# Patient Record
Sex: Male | Born: 1945
Health system: Southern US, Community
[De-identification: ages and names within clinical notes are randomized; demographics above are authoritative.]

## PROBLEM LIST (undated history)

## (undated) DIAGNOSIS — I2510 Atherosclerotic heart disease of native coronary artery without angina pectoris: Secondary | ICD-10-CM

## (undated) DIAGNOSIS — I509 Heart failure, unspecified: Secondary | ICD-10-CM

## (undated) DIAGNOSIS — M1990 Unspecified osteoarthritis, unspecified site: Secondary | ICD-10-CM

## (undated) DIAGNOSIS — E119 Type 2 diabetes mellitus without complications: Secondary | ICD-10-CM

## (undated) DIAGNOSIS — I10 Essential (primary) hypertension: Secondary | ICD-10-CM

## (undated) DIAGNOSIS — J449 Chronic obstructive pulmonary disease, unspecified: Secondary | ICD-10-CM

## (undated) DIAGNOSIS — E079 Disorder of thyroid, unspecified: Secondary | ICD-10-CM

---

## 2020-10-02 ENCOUNTER — Other Ambulatory Visit (HOSPITAL_COMMUNITY): Payer: Self-pay | Admitting: Neurosurgery

## 2020-10-02 DIAGNOSIS — G2111 Neuroleptic induced parkinsonism: Secondary | ICD-10-CM

## 2020-11-02 ENCOUNTER — Other Ambulatory Visit (HOSPITAL_COMMUNITY): Payer: Self-pay

## 2020-11-02 ENCOUNTER — Inpatient Hospital Stay (HOSPITAL_COMMUNITY): Admission: RE | Admit: 2020-11-02 | Payer: Self-pay | Source: Ambulatory Visit

## 2020-11-03 ENCOUNTER — Encounter (HOSPITAL_COMMUNITY)
Admission: RE | Admit: 2020-11-03 | Discharge: 2020-11-03 | Disposition: A | Discharge status: Home / Self Care | Payer: No Typology Code available for payment source | Source: Ambulatory Visit | Attending: Neurosurgery | Admitting: Neurosurgery

## 2020-11-03 ENCOUNTER — Other Ambulatory Visit: Payer: Self-pay

## 2020-11-03 DIAGNOSIS — G2111 Neuroleptic induced parkinsonism: Secondary | ICD-10-CM | POA: Diagnosis not present

## 2020-11-03 MED ORDER — IOFLUPANE I 123 185 MBQ/2.5ML IV SOLN
4.2000 | Freq: Once | INTRAVENOUS | Status: AC | PRN
  Administered 2020-11-03: 4.2 via INTRAVENOUS
  Filled 2020-11-03: qty 5

## 2020-11-03 MED ORDER — POTASSIUM IODIDE (ANTIDOTE) 130 MG PO TABS: 130.0000 mg | ORAL_TABLET | Freq: Once | ORAL | Status: DC

## 2020-11-03 MED ORDER — POTASSIUM IODIDE (ANTIDOTE) 130 MG PO TABS
ORAL_TABLET | ORAL | Status: AC
  Filled 2020-11-03: qty 1

## 2021-01-11 ENCOUNTER — Other Ambulatory Visit: Payer: Self-pay

## 2021-01-11 ENCOUNTER — Emergency Department (HOSPITAL_COMMUNITY): Payer: No Typology Code available for payment source

## 2021-01-11 ENCOUNTER — Encounter (HOSPITAL_COMMUNITY): Payer: Self-pay | Admitting: Emergency Medicine

## 2021-01-11 ENCOUNTER — Inpatient Hospital Stay (HOSPITAL_COMMUNITY)
Admission: EM | Admit: 2021-01-11 | Discharge: 2021-01-14 | DRG: 312 | Disposition: A | Discharge status: Home / Self Care | Payer: No Typology Code available for payment source | Attending: Internal Medicine | Admitting: Internal Medicine

## 2021-01-11 DIAGNOSIS — K219 Gastro-esophageal reflux disease without esophagitis: Secondary | ICD-10-CM | POA: Diagnosis present

## 2021-01-11 DIAGNOSIS — I952 Hypotension due to drugs: Principal | ICD-10-CM | POA: Diagnosis present

## 2021-01-11 DIAGNOSIS — I110 Hypertensive heart disease with heart failure: Secondary | ICD-10-CM | POA: Diagnosis present

## 2021-01-11 DIAGNOSIS — T463X5A Adverse effect of coronary vasodilators, initial encounter: Secondary | ICD-10-CM | POA: Diagnosis present

## 2021-01-11 DIAGNOSIS — G309 Alzheimer's disease, unspecified: Secondary | ICD-10-CM | POA: Diagnosis present

## 2021-01-11 DIAGNOSIS — Z87892 Personal history of anaphylaxis: Secondary | ICD-10-CM

## 2021-01-11 DIAGNOSIS — T438X5A Adverse effect of other psychotropic drugs, initial encounter: Secondary | ICD-10-CM | POA: Diagnosis present

## 2021-01-11 DIAGNOSIS — T447X5A Adverse effect of beta-adrenoreceptor antagonists, initial encounter: Secondary | ICD-10-CM | POA: Diagnosis present

## 2021-01-11 DIAGNOSIS — Z66 Do not resuscitate: Secondary | ICD-10-CM | POA: Diagnosis present

## 2021-01-11 DIAGNOSIS — Z87891 Personal history of nicotine dependence: Secondary | ICD-10-CM

## 2021-01-11 DIAGNOSIS — Z79899 Other long term (current) drug therapy: Secondary | ICD-10-CM

## 2021-01-11 DIAGNOSIS — J449 Chronic obstructive pulmonary disease, unspecified: Secondary | ICD-10-CM | POA: Diagnosis present

## 2021-01-11 DIAGNOSIS — I10 Essential (primary) hypertension: Secondary | ICD-10-CM | POA: Diagnosis not present

## 2021-01-11 DIAGNOSIS — Z888 Allergy status to other drugs, medicaments and biological substances status: Secondary | ICD-10-CM

## 2021-01-11 DIAGNOSIS — E039 Hypothyroidism, unspecified: Secondary | ICD-10-CM | POA: Diagnosis present

## 2021-01-11 DIAGNOSIS — F32A Depression, unspecified: Secondary | ICD-10-CM | POA: Diagnosis present

## 2021-01-11 DIAGNOSIS — R001 Bradycardia, unspecified: Secondary | ICD-10-CM | POA: Diagnosis not present

## 2021-01-11 DIAGNOSIS — E119 Type 2 diabetes mellitus without complications: Secondary | ICD-10-CM | POA: Diagnosis present

## 2021-01-11 DIAGNOSIS — Z20822 Contact with and (suspected) exposure to covid-19: Secondary | ICD-10-CM | POA: Diagnosis present

## 2021-01-11 DIAGNOSIS — I5032 Chronic diastolic (congestive) heart failure: Secondary | ICD-10-CM | POA: Diagnosis present

## 2021-01-11 DIAGNOSIS — I509 Heart failure, unspecified: Secondary | ICD-10-CM

## 2021-01-11 DIAGNOSIS — E669 Obesity, unspecified: Secondary | ICD-10-CM | POA: Diagnosis present

## 2021-01-11 DIAGNOSIS — F0280 Dementia in other diseases classified elsewhere without behavioral disturbance: Secondary | ICD-10-CM | POA: Diagnosis present

## 2021-01-11 DIAGNOSIS — I2510 Atherosclerotic heart disease of native coronary artery without angina pectoris: Secondary | ICD-10-CM | POA: Diagnosis present

## 2021-01-11 DIAGNOSIS — T461X5A Adverse effect of calcium-channel blockers, initial encounter: Secondary | ICD-10-CM | POA: Diagnosis present

## 2021-01-11 DIAGNOSIS — Z91041 Radiographic dye allergy status: Secondary | ICD-10-CM

## 2021-01-11 DIAGNOSIS — F0390 Unspecified dementia without behavioral disturbance: Secondary | ICD-10-CM

## 2021-01-11 DIAGNOSIS — G4733 Obstructive sleep apnea (adult) (pediatric): Secondary | ICD-10-CM | POA: Diagnosis present

## 2021-01-11 DIAGNOSIS — T441X5A Adverse effect of other parasympathomimetics [cholinergics], initial encounter: Secondary | ICD-10-CM | POA: Diagnosis present

## 2021-01-11 DIAGNOSIS — M1990 Unspecified osteoarthritis, unspecified site: Secondary | ICD-10-CM | POA: Diagnosis present

## 2021-01-11 HISTORY — DX: Type 2 diabetes mellitus without complications: E11.9

## 2021-01-11 HISTORY — DX: Atherosclerotic heart disease of native coronary artery without angina pectoris: I25.10

## 2021-01-11 HISTORY — DX: Disorder of thyroid, unspecified: E07.9

## 2021-01-11 HISTORY — DX: Chronic obstructive pulmonary disease, unspecified: J44.9

## 2021-01-11 HISTORY — DX: Unspecified osteoarthritis, unspecified site: M19.90

## 2021-01-11 HISTORY — DX: Essential (primary) hypertension: I10

## 2021-01-11 HISTORY — DX: Heart failure, unspecified: I50.9

## 2021-01-11 LAB — COMPREHENSIVE METABOLIC PANEL
ALT: 35 U/L (ref 0–44)
AST: 28 U/L (ref 15–41)
Albumin: 4 g/dL (ref 3.5–5.0)
Alkaline Phosphatase: 85 U/L (ref 38–126)
Anion gap: 6 (ref 5–15)
BUN: 17 mg/dL (ref 8–23)
CO2: 30 mmol/L (ref 22–32)
Calcium: 9.2 mg/dL (ref 8.9–10.3)
Chloride: 103 mmol/L (ref 98–111)
Creatinine, Ser: 1.02 mg/dL (ref 0.61–1.24)
GFR, Estimated: >60 mL/min (ref >60)
Glucose, Bld: 104 mg/dL — ABNORMAL HIGH (ref 70–99)
Potassium: 4.3 mmol/L (ref 3.5–5.1)
Sodium: 139 mmol/L (ref 135–145)
Total Bilirubin: 0.7 mg/dL (ref 0.3–1.2)
Total Protein: 7 g/dL (ref 6.5–8.1)

## 2021-01-11 LAB — CBC
HCT: 40.6 % (ref 39.0–52.0)
Hemoglobin: 13.5 g/dL (ref 13.0–17.0)
MCH: 32.8 pg (ref 26.0–34.0)
MCHC: 33.3 g/dL (ref 30.0–36.0)
MCV: 98.5 fL (ref 80.0–100.0)
Platelets: 267 10*3/uL (ref 150–400)
RBC: 4.12 MIL/uL — ABNORMAL LOW (ref 4.22–5.81)
RDW: 12.4 % (ref 11.5–15.5)
WBC: 10.9 10*3/uL — ABNORMAL HIGH (ref 4.0–10.5)
nRBC: 0 % (ref 0.0–0.2)

## 2021-01-11 LAB — URINALYSIS, ROUTINE W REFLEX MICROSCOPIC
Bilirubin Urine: NEGATIVE
Glucose, UA: NEGATIVE mg/dL
Hgb urine dipstick: NEGATIVE
Ketones, ur: NEGATIVE mg/dL
Leukocytes,Ua: NEGATIVE
Nitrite: NEGATIVE
Protein, ur: NEGATIVE mg/dL
Specific Gravity, Urine: 1.016 (ref 1.005–1.030)
pH: 6 (ref 5.0–8.0)

## 2021-01-11 LAB — CBG MONITORING, ED: Glucose-Capillary: 123 mg/dL — ABNORMAL HIGH (ref 70–99)

## 2021-01-11 LAB — RESP PANEL BY RT-PCR (FLU A&B, COVID) ARPGX2
Influenza A by PCR: NEGATIVE
Influenza B by PCR: NEGATIVE
SARS Coronavirus 2 by RT PCR: NEGATIVE

## 2021-01-11 LAB — TROPONIN I (HIGH SENSITIVITY): Troponin I (High Sensitivity): 11 ng/L (ref <18)

## 2021-01-11 LAB — LACTIC ACID, PLASMA: Lactic Acid, Venous: 1.3 mmol/L (ref 0.5–1.9)

## 2021-01-11 MED ORDER — KETOROLAC TROMETHAMINE 0.5 % OP SOLN
1.0000 [drp] | Freq: Four times a day (QID) | OPHTHALMIC | Status: DC | PRN
  Administered 2021-01-12 – 2021-01-13 (×2): 1 [drp] via OPHTHALMIC
  Filled 2021-01-11: qty 5

## 2021-01-11 MED ORDER — AMMONIUM LACTATE 12 % EX LOTN
1.0000 "application " | TOPICAL_LOTION | Freq: Every day | CUTANEOUS | Status: DC
  Administered 2021-01-12: 1 via TOPICAL
  Filled 2021-01-11: qty 225

## 2021-01-11 MED ORDER — MULTIVITAMIN PO TABS: 1.0000 | ORAL_TABLET | Freq: Every day | ORAL | Status: DC

## 2021-01-11 MED ORDER — ACETAMINOPHEN 650 MG RE SUPP: 650.0000 mg | Freq: Four times a day (QID) | RECTAL | Status: DC | PRN

## 2021-01-11 MED ORDER — LOSARTAN POTASSIUM 50 MG PO TABS
100.0000 mg | ORAL_TABLET | Freq: Every day | ORAL | Status: DC
  Administered 2021-01-12 – 2021-01-14 (×3): 100 mg via ORAL
  Filled 2021-01-11 (×4): qty 2

## 2021-01-11 MED ORDER — MUPIROCIN 2 % EX OINT
1.0000 "application " | TOPICAL_OINTMENT | Freq: Two times a day (BID) | CUTANEOUS | Status: DC | PRN
  Filled 2021-01-11: qty 22

## 2021-01-11 MED ORDER — MEMANTINE HCL 10 MG PO TABS
20.0000 mg | ORAL_TABLET | Freq: Every day | ORAL | Status: DC
  Administered 2021-01-11 – 2021-01-13 (×3): 20 mg via ORAL
  Filled 2021-01-11 (×4): qty 2

## 2021-01-11 MED ORDER — SENNA-DOCUSATE SODIUM 8.6-50 MG PO TABS: 2.0000 | ORAL_TABLET | Freq: Every day | ORAL | Status: DC | PRN

## 2021-01-11 MED ORDER — ROSUVASTATIN CALCIUM 5 MG PO TABS
10.0000 mg | ORAL_TABLET | Freq: Every day | ORAL | Status: DC
  Administered 2021-01-12 – 2021-01-13 (×3): 10 mg via ORAL
  Filled 2021-01-11 (×3): qty 2

## 2021-01-11 MED ORDER — IPRATROPIUM BROMIDE 0.06 % NA SOLN
2.0000 | Freq: Four times a day (QID) | NASAL | Status: DC
  Administered 2021-01-12 – 2021-01-13 (×4): 2 via NASAL
  Filled 2021-01-11 (×2): qty 15

## 2021-01-11 MED ORDER — PANTOPRAZOLE SODIUM 40 MG PO TBEC
40.0000 mg | DELAYED_RELEASE_TABLET | Freq: Every day | ORAL | Status: DC
  Administered 2021-01-12 – 2021-01-14 (×3): 40 mg via ORAL
  Filled 2021-01-11 (×3): qty 1

## 2021-01-11 MED ORDER — SENNOSIDES-DOCUSATE SODIUM 8.6-50 MG PO TABS: 2.0000 | ORAL_TABLET | Freq: Every day | ORAL | Status: DC | PRN

## 2021-01-11 MED ORDER — DONEPEZIL HCL 10 MG PO TABS
10.0000 mg | ORAL_TABLET | Freq: Every day | ORAL | Status: DC
  Administered 2021-01-11 – 2021-01-13 (×3): 10 mg via ORAL
  Filled 2021-01-11 (×3): qty 1

## 2021-01-11 MED ORDER — ENOXAPARIN SODIUM 40 MG/0.4ML ~~LOC~~ SOLN
40.0000 mg | SUBCUTANEOUS | Status: DC
  Administered 2021-01-12: 40 mg via SUBCUTANEOUS
  Filled 2021-01-11: qty 0.4

## 2021-01-11 MED ORDER — AQUAPHILIC/CARBAMIDE 10 % EX OINT: 1.0000 "application " | TOPICAL_OINTMENT | Freq: Every day | CUTANEOUS | Status: DC

## 2021-01-11 MED ORDER — ASPIRIN EC 81 MG PO TBEC
81.0000 mg | DELAYED_RELEASE_TABLET | Freq: Every day | ORAL | Status: DC
  Administered 2021-01-12 – 2021-01-14 (×3): 81 mg via ORAL
  Filled 2021-01-11 (×3): qty 1

## 2021-01-11 MED ORDER — MIRTAZAPINE 15 MG PO TABS
7.5000 mg | ORAL_TABLET | Freq: Every day | ORAL | Status: DC
  Administered 2021-01-11 – 2021-01-13 (×3): 7.5 mg via ORAL
  Filled 2021-01-11 (×3): qty 1

## 2021-01-11 MED ORDER — ADULT MULTIVITAMIN W/MINERALS CH
1.0000 | ORAL_TABLET | Freq: Every day | ORAL | Status: DC
  Administered 2021-01-12 – 2021-01-14 (×3): 1 via ORAL
  Filled 2021-01-11 (×3): qty 1

## 2021-01-11 MED ORDER — ACETAMINOPHEN 325 MG PO TABS: 650.0000 mg | ORAL_TABLET | Freq: Four times a day (QID) | ORAL | Status: DC | PRN

## 2021-01-11 MED ORDER — POTASSIUM CHLORIDE CRYS ER 20 MEQ PO TBCR
10.0000 meq | EXTENDED_RELEASE_TABLET | Freq: Two times a day (BID) | ORAL | Status: DC
  Administered 2021-01-12 (×2): 20 meq via ORAL
  Filled 2021-01-11 (×2): qty 1

## 2021-01-11 MED ORDER — QUETIAPINE FUMARATE 25 MG PO TABS
25.0000 mg | ORAL_TABLET | Freq: Every day | ORAL | Status: DC
  Administered 2021-01-12 – 2021-01-13 (×3): 25 mg via ORAL
  Filled 2021-01-11 (×4): qty 1

## 2021-01-11 MED ORDER — TRIAMCINOLONE ACETONIDE 0.1 % EX CREA
1.0000 "application " | TOPICAL_CREAM | Freq: Every day | CUTANEOUS | Status: DC
  Administered 2021-01-12 – 2021-01-13 (×2): 1 via TOPICAL
  Filled 2021-01-11: qty 15

## 2021-01-11 MED ORDER — LORATADINE 10 MG PO TABS
10.0000 mg | ORAL_TABLET | Freq: Every day | ORAL | Status: DC
  Administered 2021-01-12 – 2021-01-14 (×3): 10 mg via ORAL
  Filled 2021-01-11 (×3): qty 1

## 2021-01-11 MED ORDER — CITALOPRAM HYDROBROMIDE 40 MG PO TABS
40.0000 mg | ORAL_TABLET | Freq: Every day | ORAL | Status: DC
  Administered 2021-01-12 – 2021-01-13 (×2): 40 mg via ORAL
  Filled 2021-01-11 (×3): qty 1

## 2021-01-11 MED ORDER — FUROSEMIDE 40 MG PO TABS
40.0000 mg | ORAL_TABLET | Freq: Two times a day (BID) | ORAL | Status: DC
  Administered 2021-01-12 – 2021-01-13 (×4): 40 mg via ORAL
  Filled 2021-01-11 (×4): qty 1

## 2021-01-11 MED ORDER — SULFASALAZINE 500 MG PO TABS
500.0000 mg | ORAL_TABLET | Freq: Two times a day (BID) | ORAL | Status: DC
  Administered 2021-01-12 – 2021-01-14 (×6): 500 mg via ORAL
  Filled 2021-01-11 (×8): qty 1

## 2021-01-11 MED ORDER — FERROUS SULFATE 325 (65 FE) MG PO TABS
325.0000 mg | ORAL_TABLET | Freq: Two times a day (BID) | ORAL | Status: DC
  Administered 2021-01-12 – 2021-01-14 (×5): 325 mg via ORAL
  Filled 2021-01-11 (×5): qty 1

## 2021-01-11 NOTE — H&P (Addendum)
Date: 01/11/2021               Patient Name:  Stephen Roberson MRN: [id number]  DOB: 03-31-1946 Age / Sex: 75 y.o., male   PCP: [facility]         Medical Service: Internal Medicine Teaching Service         Attending Physician: Dr. Oswaldo Done, Marquita Palms, *    First Contact: Dr. Laural Benes Pager: [phone]  Second Contact: Dr. Barbaraann Faster Pager: [phone]       After Hours (After 5p/  First Contact Pager: [phone]  weekends / holidays): Second Contact Pager: [phone]   Chief Complaint: Bradycardia  History of Present Illness: Mr. Stephen Roberson is a retired veteran 75 year old male with a medical history significant of hypertension, hyperlipidemia, coronary artery disease, COPD, dementia and congestive heart failure presenting with bradycardia and low blood pressures.  History was mostly obtained from the wife at the bedside.  He was in his usual state of health up until this morning.  His wife checked his vitals and found his heart rate to be 38 and blood pressure to be around 70/50.  She reports that he looks a little bit pale and reported chest pain and right-sided neck pain, both of which are intermittent and chronic in nature.  She denies any changes in his mentation at this time.  He did feel dizzy upon standing up. She called EMS who gave the patient 325 mg aspirin.  His symptoms resolved after about an hour and a half.  He took all of his medications this morning.  Wife denies any recent changes to his medications or diet.  He has been eating and drinking well.  He has been on most all of his medications for many years.  Only changes to medication recently was stopping his Plavix 75 mg couple years ago.  Of note, she did mention his pulse was 47 yesterday.  She states his heart rate typically resides in the 50s and systolic blood pressures are normally 110s.  At baseline, the patient is generally very tired and often stares off into space however he is able to communicate with his family  and feed himself.  He does get confused at times. He requires a lot of assistance with his ADLs such as maneuvering around his house and putting on his shoes.  He is able to ambulate on his own however typically needs guidance and at times use of a walker.  Patient reports his right-sided neck pain has been going on for a couple months.  Pain is intermittent and worse with movement.  He also endorsed intermittent nonradiating left-sided chest pain for the past few months.  Currently his symptom-free.  Denies any diaphoresis, syncope, shortness of breath, abdominal pain, nausea or vomiting, constipation or diarrhea, and orthopnea.  He does state he feels more full than usual.  Endorses intermittent bilateral lower extremity swelling when he does not elevate his legs.   Meds:  Current Meds  Medication Sig  . acetaminophen (TYLENOL) 500 MG tablet Take 1,000 mg by mouth in the morning and at bedtime.  Marland Kitchen ammonium lactate (LAC-HYDRIN) 12 % lotion Apply 1 application topically daily.  Marland Kitchen aspirin 81 MG EC tablet Take 81 mg by mouth daily. Swallow whole.  . citalopram (CELEXA) 40 MG tablet Take 40 mg by mouth daily.  Marland Kitchen diltiazem (TIAZAC) 240 MG 24 hr capsule Take 240 mg by mouth in the morning and at bedtime.  . docusate sodium (  COLACE) 250 MG capsule Take 250 mg by mouth 2 (two) times daily.  Marland Kitchen donepezil (ARICEPT) 5 MG tablet Take 10 mg by mouth at bedtime.  . ferrous sulfate 325 (65 FE) MG EC tablet Take 325 mg by mouth in the morning and at bedtime.  . furosemide (LASIX) 40 MG tablet Take 40 mg by mouth 2 (two) times daily.  Marland Kitchen ibuprofen (ADVIL) 800 MG tablet Take 800 mg by mouth every 8 (eight) hours as needed for mild pain.  Marland Kitchen ipratropium (ATROVENT) 0.06 % nasal spray Place 2 sprays into both nostrils 4 (four) times daily.  . isosorbide mononitrate (IMDUR) 60 MG 24 hr tablet Take 60 mg by mouth in the morning and at bedtime.  Marland Kitchen ketorolac (ACULAR) 0.5 % ophthalmic solution Place 1 drop into both eyes  4 (four) times daily as needed (itching eyes).  Marland Kitchen loratadine (CLARITIN) 10 MG tablet Take 10 mg by mouth daily.  Marland Kitchen losartan (COZAAR) 100 MG tablet Take 100 mg by mouth daily.  . memantine (NAMENDA) 10 MG tablet Take 20 mg by mouth at bedtime.  . metoprolol tartrate (LOPRESSOR) 50 MG tablet Take 75 mg by mouth 2 (two) times daily.  . mirtazapine (REMERON) 7.5 MG tablet Take 7.5 mg by mouth at bedtime.  . Multiple Vitamin (MULTIVITAMIN PO) Take 1 tablet by mouth in the morning and at bedtime. PropaxGold with NTFactor Plus Reveratrol & Co Q10 Vitamins: (Multi-vitamin, Multi-Mineral, Antioxidant & Pre/Probiotic Complexes Plus NTFactor  . mupirocin ointment (BACTROBAN) 2 % Apply 1 application topically 2 (two) times daily as needed (rash).  . nitroGLYCERIN (NITROSTAT) 0.4 MG SL tablet Place 0.4 mg under the tongue every 5 (five) minutes as needed for chest pain.  Marland Kitchen omeprazole (PRILOSEC) 20 MG capsule Take 20 mg by mouth 2 (two) times daily before a meal.  . potassium chloride (KLOR-CON) 10 MEQ tablet Take 10-20 mEq by mouth 2 (two) times daily. Take 2 tablets (20 meq) in the morning and Take 1 tablet (10 meq) at bedtime  . QUEtiapine (SEROQUEL) 25 MG tablet Take 25 mg by mouth at bedtime.  . rizatriptan (MAXALT) 10 MG tablet Take 10 mg by mouth every 2 (two) hours as needed for migraine. May repeat in 2 hours if needed  . rosuvastatin (CRESTOR) 10 MG tablet Take 10 mg by mouth at bedtime.  . sennosides-docusate sodium (SENOKOT-S) 8.6-50 MG tablet Take 2 tablets by mouth daily as needed for constipation.  . sulfaSALAzine (AZULFIDINE) 500 MG tablet Take 500 mg by mouth 2 (two) times daily.  Marland Kitchen triamcinolone (KENALOG) 0.1 % Apply 1 application topically daily.  Marland Kitchen triamcinolone 0.1%-silver sulfadiazine 1:1 cream mixture Apply 1 application topically daily.  . Urea (AQUAPHILIC/CARBAMIDE) 10 % OINT Apply 1 application topically in the morning and at bedtime.     Allergies: Allergies as of 01/11/2021 -  Review Complete 01/11/2021  Allergen Reaction Noted  . Clonidine derivatives Anaphylaxis 01/11/2021  . Iodinated diagnostic agents Anaphylaxis 01/11/2021  . Lipitor [atorvastatin] Anaphylaxis 01/11/2021  . Proventil [albuterol] Anaphylaxis 01/11/2021  . Valium [diazepam] Anaphylaxis 01/11/2021   Past Medical History:  Diagnosis Date  . Arthritis   . CHF (congestive heart failure) (HCC)   . COPD (chronic obstructive pulmonary disease) (HCC)   . Coronary artery disease   . Diabetes mellitus without complication (HCC)   . Hypertension   . Thyroid disease    hypothyroidism    Family History:   Family History  Problem Relation Age of Onset  . Cancer Mother   .  Cancer Father   . 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 Lives at home with his wife.  Able to feed himself however requires assistance with most other ADLs.  Has dementia, at baseline able to communicate, at times stares off into space.  Review of Systems: A complete ROS was negative except as per HPI.   Physical Exam: Blood pressure (!) 191/93, pulse (!) 54, temperature 97.9 F (36.6 C), temperature source Oral, resp. rate 16, height 5\' 4"  (1.626 m), weight 107.5 kg, SpO2 96 %.  Physical Exam Vitals reviewed.  Constitutional:      General: He is not in acute distress.    Appearance: Normal appearance. He is not ill-appearing or diaphoretic.  HENT:     Head: Normocephalic and atraumatic.  Eyes:     Extraocular Movements: Extraocular movements intact.     Conjunctiva/sclera: Conjunctivae normal.  Cardiovascular:     Rate and Rhythm: Regular rhythm. Bradycardia present.     Pulses: Normal pulses.     Heart sounds: Normal heart sounds. No murmur heard. No friction rub. No gallop.   Pulmonary:     Effort: No respiratory distress.     Breath sounds: Normal breath sounds. No wheezing or rales.  Chest:     Chest wall: No tenderness.  Abdominal:     General: Bowel  sounds are normal. There is distension.     Palpations: Abdomen is soft.     Tenderness: There is no abdominal tenderness. There is no guarding.  Musculoskeletal:        General: No swelling or tenderness.     Cervical back: Neck supple. No rigidity or tenderness.     Right lower leg: No edema.     Left lower leg: No edema.  Skin:    General: Skin is warm and dry.  Neurological:     Mental Status: He is alert. Mental status is at baseline.  Psychiatric:        Mood and Affect: Mood normal.        Behavior: Behavior normal.        Thought Content: Thought content normal.        Judgment: Judgment normal.     EKG: personally reviewed my interpretation is sinus bradycardia  CXR: personally reviewed my interpretation is cardiomegaly and mild interstitial prominence  Assessment & Plan by Problem: Active Problems:   Bradycardia   Hypertension  Mr. Nilson Tabora is a retired veteran 75 year old male with a medical history significant of hypertension, hyperlipidemia, coronary artery disease, COPD, dementia and congestive heart failure presenting with bradycardia and low blood pressures.  Bradycardia Patient reportedly had a heart rate of 38 at home this morning; heart rate typically resides in the 50s.  Blood pressures were also reported to be 70/50.  At this time the family reported he was pale, dizzy upon standing, and endorsed right-sided neck and left-sided chest pain which is intermittent and chronic in nature.  EMS was subsequently called and EKG showed sinus bradycardia.  Symptoms resolved after about an hour and a half.  Patient is on diltiazem 240 mg twice daily, metoprolol 75 mg twice daily, losartan 100 mg daily and Imdur 60 mg daily.  No recent changes to these medications.  Bradycardia could be in the setting of high doses of metoprolol and diltiazem versus carotid sinus massage.  Patient has had right-sided neck pain for the past few months, question whether he may be indirectly  inducing  a carotid sinus massage when applying pressure to his neck pain. -Cardiac monitoring -If symptomatic may need to consider atropine, chronotropic support or transcutaneous pacing -Hold diltiazem and metoprolol -Follow-up echocardiogram  Hypertension Home medications include diltiazem 240 mg twice daily, metoprolol 75 mg twice daily, furosemide 40 mg twice daily, losartan 100 mg daily and Imdur 60 mg daily.  Patient was hypotensive upon EMS arrival however now with a systolic 183/88.  Blood pressure goal less than 220 systolic.  Will continue to monitor. - Holding diltiazem, metoprolol and Imdur in the setting of bradycardia - Resume losartan 100 mg daily  CHF History of heart failure.   - Continue furosemide 40 mg twice daily - Follow-up echocardiogram  Dementia At baseline, he is able to communicate however needs assistance with most of his ADLs. -Continue home medications donepezil 10 mg, memantine 20 mg, Seroquel 25  Depression -Continue citalopram 40 mg daily  GERD Home medication Meprazole 20 mg twice daily.  Continue Protonix 40 mg daily  OSA - CPAP nightly   Diet: HH VTE ppx: Lovenox Code status: DNR  Dispo: Admit patient to Observation with expected length of stay less than 2 midnights.  SignedJaci Standard: Jhovany Weidinger N, DO 01/11/2021, 9:27 PM  Pager: [phone] After 5pm on weekdays and 1pm on weekends: On Call pager: [phone]

## 2021-01-11 NOTE — ED Provider Notes (Signed)
Patient is a 75 year old male with a history of dementia who is here because his home health nurse found his heart rate was slow and his blood pressure was low.  Patient is in no distress.  Heart 40s, however his blood pressures been appropriate since he has been here. Patient currently on beta blocker and calcium channel blocker. EKG sinus bradycardia. Patient is pending the rest of his labs and work-up.  Physical Exam  BP (!) 183/87 (BP Location: Right Arm)   Pulse 63   Temp 97.7 F (36.5 C) (Oral)   Resp 19   Ht 5\' 4"  (1.626 m)   Wt 107.5 kg   SpO2 96%   BMI 40.68 kg/m   Physical Exam Vitals and nursing note reviewed.  Constitutional:      General: He is not in acute distress.    Appearance: He is well-developed.  HENT:     Head: Normocephalic and atraumatic.     Right Ear: External ear normal.     Left Ear: External ear normal.  Eyes:     Conjunctiva/sclera: Conjunctivae normal.  Cardiovascular:     Rate and Rhythm: Normal rate and regular rhythm.  Pulmonary:     Effort: Pulmonary effort is normal.     Breath sounds: Normal breath sounds.  Abdominal:     Palpations: Abdomen is soft.     Tenderness: There is no abdominal tenderness.  Musculoskeletal:     Cervical back: Neck supple.  Skin:    General: Skin is warm and dry.  Neurological:     Mental Status: He is alert.     ED Course/Procedures   Clinical Course as of 01/12/21 0017  Mon Jan 11, 2021  5331 75 year old male with dementia here after family found his heart rate and blood pressure to be low.  Patient does not appear in any distress.  Bradycardia in the 40s with good blood pressure.  Getting labs EKG chest x-ray.  Likely can be discharged if no acute findings [MB]  1502 Care signed out pending further work up and monitoring.  [LM]  1520 DM CHF HTN on beta blocker, [facility] pt Sh[facility] -> vitals after  Hypotensive and bradycardic (70s systolic) EMS 84 systolic, 30-40s, mental status baseline  Cardiac w/u [JK]     Clinical Course User Index [JK] Kugler, 1503, MD [LM] [place], PA-C [MB] Jeannie Fend, MD    Procedures  MDM   HDS here. HR 39-41, but blood pressure appropriate since arrival.   Patient complaining of chest pain, EKG and troponin ordered. CXR without PTX but with interstitial prominence.  Pending UA, lactic, troponin, COVID.    Normal lactic acid. WBC 10.9.  No evidence of anemia.  No evidence for urinary tract infection.  Troponin 11.  No significant abnormality on CMP.  Given age, comorbidity, medication use, and HR while in ED, feel that patient will benefit from admission for beta-blocker wash out. Will admit to medicine for further evaluation and workup of presenting symptoms.    Kugler, Terrilee Files, MD 01/12/21 01/14/21    Lorenda Cahill, MD 01/13/21 [phone]

## 2021-01-11 NOTE — ED Provider Notes (Signed)
[facility] EMERGENCY DEPARTMENT Provider Note   CSN: [id number] Arrival date & time: 01/11/21  1346     History Chief Complaint  Patient presents with  . Bradycardia    38-40 bpm    Stephen Roberson is a 75 y.o. male.  75 year old male brought in by EMS from home for low BP and low heart rate. History provided by EMS, awaiting arrival of family member. Per EMS, patient found to have BP in the 80s, HR in the 30s. Patient's wife arrives and states patient has advanced dementia, home health nurse gave patient his shower this morning and afterwards checked his vitals and found his blood pressure to be below 70, confirmed on manual and EMS was called.  Also found to have heart rate in the 30s.  Patient's wife has a notebook at bedside with daily recording of vital signs, blood pressure is normally around 110 systolic with heart rates typically in the 40s to 50s.  Patient is on a beta-blocker, medications are administered to me by family.  Patient has been rubbing his neck recently and today around his chest, unsure if these are areas that are hurting him as he is not a good historian.  Family reports normal p.o. intake, normal activity, not recently ill and not prone to urinary tract infection.        Past Medical History:  Diagnosis Date  . Arthritis   . CHF (congestive heart failure) (HCC)   . COPD (chronic obstructive pulmonary disease) (HCC)   . Coronary artery disease   . Diabetes mellitus without complication (HCC)   . Hypertension   . Thyroid disease    hypothyroidism    There are no problems to display for this patient.   History reviewed. No pertinent surgical history.     Family History  Problem Relation Age of Onset  . Cancer Mother   . Cancer Father   . Cancer Brother     Social History   Tobacco Use  . Smoking status: Former Games developer  . Smokeless tobacco: Never Used  . Tobacco comment: quit 20 years ago  Substance Use Topics  . Alcohol  use: Never  . Drug use: Never    Home Medications Prior to Admission medications   Not on File    Allergies    Patient has no allergy information on record.  Review of Systems   Review of Systems  Unable to perform ROS: Dementia    Physical Exam Updated Vital Signs BP (!) 126/56   Pulse (!) 39   Temp 97.6 F (36.4 C) (Oral)   Resp 14   Ht 5\' 4"  (1.626 m)   Wt 107.5 kg   SpO2 96%   BMI 40.68 kg/m   Physical Exam Vitals and nursing note reviewed.  Constitutional:      General: He is not in acute distress.    Appearance: He is well-developed. He is not diaphoretic.  HENT:     Head: Normocephalic and atraumatic.     Mouth/Throat:     Mouth: Mucous membranes are moist.  Eyes:     Extraocular Movements: Extraocular movements intact.     Pupils: Pupils are equal, round, and reactive to light.  Cardiovascular:     Rate and Rhythm: Regular rhythm. Bradycardia present.     Pulses: Normal pulses.     Heart sounds: Normal heart sounds.  Pulmonary:     Effort: Pulmonary effort is normal.     Breath sounds:  Normal breath sounds.  Abdominal:     Palpations: Abdomen is soft.     Tenderness: There is no abdominal tenderness.  Musculoskeletal:     Cervical back: Neck supple.     Right lower leg: No edema.     Left lower leg: No edema.  Skin:    General: Skin is warm and dry.     Findings: No erythema or rash.  Neurological:     Mental Status: He is alert. Mental status is at baseline.  Psychiatric:        Behavior: Behavior normal.     ED Results / Procedures / Treatments   Labs (all labs ordered are listed, but only abnormal results are displayed) Labs Reviewed  CBC - Abnormal; Notable for the following components:      Result Value   WBC 10.9 (*)    RBC 4.12 (*)    All other components within normal limits  CBG MONITORING, ED - Abnormal; Notable for the following components:   Glucose-Capillary 123 (*)    All other components within normal limits  RESP  PANEL BY RT-PCR (FLU A&B, COVID) [id number]  LACTIC ACID, PLASMA  LACTIC ACID, PLASMA  URINALYSIS, ROUTINE W REFLEX MICROSCOPIC    EKG EKG Interpretation  Date/Time:  Monday January 11 2021 14:00:38 EDT Ventricular Rate:  40 PR Interval:    QRS Duration: 97 QT Interval:  458 QTC Calculation: 374 R Axis:   53 Text Interpretation: Sinus bradycardia Nonspecific T abnormalities, lateral leads No old tracing to compare Confirmed by Meridee Score [phone]) on 01/11/2021 2:06:10 PM   Radiology DG Chest Port 1 View  Result Date: 01/11/2021 CLINICAL DATA:  Chest pain EXAM: PORTABLE CHEST 1 VIEW COMPARISON:  None. FINDINGS: Low lung volumes. Mild interstitial prominence. Enlargement of the cardiomediastinal silhouette. No significant pleural effusion. No pneumothorax. IMPRESSION: Mild interstitial prominence. This is age-indeterminate and could be chronic or reflect mild interstitial edema or atypical/viral pneumonia. Enlargement of the cardiomediastinal silhouette with probable mild cardiomegaly. Electronically Signed   By: Guadlupe Spanish M.D.   On: 01/11/2021 14:25    Procedures Procedures   Medications Ordered in ED Medications - No data to display  ED Course  I have reviewed the triage vital signs and the nursing notes.  Pertinent labs & imaging results that were available during my care of the patient were reviewed by me and considered in my medical decision making (see chart for details).  Clinical Course as of 01/11/21 1525  Mon Jan 11, 2021  3656 75 year old male with dementia here after family found his heart rate and blood pressure to be low.  Patient does not appear in any distress.  Bradycardia in the 40s with good blood pressure.  Getting labs EKG chest x-ray.  Likely can be discharged if no acute findings [MB]  1502 Care signed out pending further work up and monitoring.  [LM]  1520 DM CHF HTN on beta blocker, [facility] pt Sh[facility] -> vitals after  Hypotensive and bradycardic (70s  systolic) EMS 84 systolic, 30-40s, mental status baseline  Cardiac w/u [JK]    Clinical Course User Index [JK] Kugler, [place], MD [LM] Jeannie Fend, PA-C [MB] Terrilee Files, MD   MDM Rules/Calculators/A&P                          Final Clinical Impression(s) / ED Diagnoses Final diagnoses:  Bradycardia    Rx / DC Orders ED Discharge Orders  None       Alden Hipp 01/11/21 1525    Terrilee Files, MD 01/11/21 1755

## 2021-01-11 NOTE — ED Triage Notes (Signed)
Pt BIB [facility] d/t family reporting bradycardia this morning. Pt family reports HR of 38 and BP of 80/40 this am. No AMS compared to baseline. Upon EMS arrival pt reported chest pain and EMS gave 324mg  aspirin on scene with no further complaint from pt. EMS reports mild orthostatic hypotension when moving to stretcher, otherwise asymptomatic.

## 2021-01-12 ENCOUNTER — Observation Stay (HOSPITAL_COMMUNITY): Payer: No Typology Code available for payment source

## 2021-01-12 DIAGNOSIS — F32A Depression, unspecified: Secondary | ICD-10-CM | POA: Diagnosis present

## 2021-01-12 DIAGNOSIS — T438X5A Adverse effect of other psychotropic drugs, initial encounter: Secondary | ICD-10-CM | POA: Diagnosis present

## 2021-01-12 DIAGNOSIS — T463X5A Adverse effect of coronary vasodilators, initial encounter: Secondary | ICD-10-CM | POA: Diagnosis present

## 2021-01-12 DIAGNOSIS — Z87891 Personal history of nicotine dependence: Secondary | ICD-10-CM | POA: Diagnosis not present

## 2021-01-12 DIAGNOSIS — G4733 Obstructive sleep apnea (adult) (pediatric): Secondary | ICD-10-CM | POA: Diagnosis present

## 2021-01-12 DIAGNOSIS — R001 Bradycardia, unspecified: Secondary | ICD-10-CM | POA: Diagnosis present

## 2021-01-12 DIAGNOSIS — F0280 Dementia in other diseases classified elsewhere without behavioral disturbance: Secondary | ICD-10-CM | POA: Diagnosis present

## 2021-01-12 DIAGNOSIS — I10 Essential (primary) hypertension: Secondary | ICD-10-CM | POA: Diagnosis not present

## 2021-01-12 DIAGNOSIS — Z20822 Contact with and (suspected) exposure to covid-19: Secondary | ICD-10-CM | POA: Diagnosis present

## 2021-01-12 DIAGNOSIS — E669 Obesity, unspecified: Secondary | ICD-10-CM | POA: Diagnosis present

## 2021-01-12 DIAGNOSIS — I5022 Chronic systolic (congestive) heart failure: Secondary | ICD-10-CM

## 2021-01-12 DIAGNOSIS — Z66 Do not resuscitate: Secondary | ICD-10-CM | POA: Diagnosis present

## 2021-01-12 DIAGNOSIS — I110 Hypertensive heart disease with heart failure: Secondary | ICD-10-CM | POA: Diagnosis present

## 2021-01-12 DIAGNOSIS — E119 Type 2 diabetes mellitus without complications: Secondary | ICD-10-CM | POA: Diagnosis present

## 2021-01-12 DIAGNOSIS — T447X5A Adverse effect of beta-adrenoreceptor antagonists, initial encounter: Secondary | ICD-10-CM | POA: Diagnosis present

## 2021-01-12 DIAGNOSIS — T461X5A Adverse effect of calcium-channel blockers, initial encounter: Secondary | ICD-10-CM | POA: Diagnosis present

## 2021-01-12 DIAGNOSIS — F0390 Unspecified dementia without behavioral disturbance: Secondary | ICD-10-CM | POA: Diagnosis present

## 2021-01-12 DIAGNOSIS — M1990 Unspecified osteoarthritis, unspecified site: Secondary | ICD-10-CM | POA: Diagnosis present

## 2021-01-12 DIAGNOSIS — E039 Hypothyroidism, unspecified: Secondary | ICD-10-CM | POA: Diagnosis present

## 2021-01-12 DIAGNOSIS — J449 Chronic obstructive pulmonary disease, unspecified: Secondary | ICD-10-CM | POA: Diagnosis present

## 2021-01-12 DIAGNOSIS — I952 Hypotension due to drugs: Secondary | ICD-10-CM | POA: Diagnosis present

## 2021-01-12 DIAGNOSIS — I5032 Chronic diastolic (congestive) heart failure: Secondary | ICD-10-CM | POA: Diagnosis present

## 2021-01-12 DIAGNOSIS — K219 Gastro-esophageal reflux disease without esophagitis: Secondary | ICD-10-CM | POA: Diagnosis present

## 2021-01-12 DIAGNOSIS — I2510 Atherosclerotic heart disease of native coronary artery without angina pectoris: Secondary | ICD-10-CM | POA: Diagnosis present

## 2021-01-12 DIAGNOSIS — Z79899 Other long term (current) drug therapy: Secondary | ICD-10-CM | POA: Diagnosis not present

## 2021-01-12 DIAGNOSIS — T441X5A Adverse effect of other parasympathomimetics [cholinergics], initial encounter: Secondary | ICD-10-CM | POA: Diagnosis present

## 2021-01-12 DIAGNOSIS — G309 Alzheimer's disease, unspecified: Secondary | ICD-10-CM | POA: Diagnosis present

## 2021-01-12 LAB — CBC
HCT: 41.4 % (ref 39.0–52.0)
Hemoglobin: 13.9 g/dL (ref 13.0–17.0)
MCH: 32.6 pg (ref 26.0–34.0)
MCHC: 33.6 g/dL (ref 30.0–36.0)
MCV: 97 fL (ref 80.0–100.0)
Platelets: 245 10*3/uL (ref 150–400)
RBC: 4.27 MIL/uL (ref 4.22–5.81)
RDW: 12.4 % (ref 11.5–15.5)
WBC: 11.9 10*3/uL — ABNORMAL HIGH (ref 4.0–10.5)
nRBC: 0 % (ref 0.0–0.2)

## 2021-01-12 LAB — BASIC METABOLIC PANEL
Anion gap: 7 (ref 5–15)
BUN: 13 mg/dL (ref 8–23)
CO2: 27 mmol/L (ref 22–32)
Calcium: 9 mg/dL (ref 8.9–10.3)
Chloride: 103 mmol/L (ref 98–111)
Creatinine, Ser: 0.71 mg/dL (ref 0.61–1.24)
GFR, Estimated: >60 mL/min (ref >60)
Glucose, Bld: 157 mg/dL — ABNORMAL HIGH (ref 70–99)
Potassium: 3.3 mmol/L — ABNORMAL LOW (ref 3.5–5.1)
Sodium: 137 mmol/L (ref 135–145)

## 2021-01-12 LAB — TSH: TSH: 0.802 u[IU]/mL (ref 0.350–4.500)

## 2021-01-12 LAB — ECHOCARDIOGRAM COMPLETE
Area-P 1/2: 2.48 cm2
Height: 64 in
P 1/2 time: 325 msec
S' Lateral: 3.7 cm
Weight: 3876.57 oz

## 2021-01-12 MED ORDER — POTASSIUM CHLORIDE CRYS ER 10 MEQ PO TBCR
10.0000 meq | EXTENDED_RELEASE_TABLET | Freq: Every day | ORAL | Status: DC
  Administered 2021-01-12 – 2021-01-13 (×2): 10 meq via ORAL
  Filled 2021-01-12 (×2): qty 1

## 2021-01-12 MED ORDER — ACETAMINOPHEN 500 MG PO TABS
1000.0000 mg | ORAL_TABLET | Freq: Two times a day (BID) | ORAL | Status: DC
  Administered 2021-01-12 – 2021-01-14 (×4): 1000 mg via ORAL
  Filled 2021-01-12 (×4): qty 2

## 2021-01-12 MED ORDER — CHLORTHALIDONE 25 MG PO TABS
25.0000 mg | ORAL_TABLET | Freq: Every day | ORAL | Status: DC
  Administered 2021-01-12 – 2021-01-14 (×3): 25 mg via ORAL
  Filled 2021-01-12 (×4): qty 1

## 2021-01-12 MED ORDER — ACETAMINOPHEN 500 MG PO TABS
1000.0000 mg | ORAL_TABLET | Freq: Three times a day (TID) | ORAL | Status: DC | PRN
  Administered 2021-01-12: 1000 mg via ORAL
  Filled 2021-01-12: qty 2

## 2021-01-12 MED ORDER — ENOXAPARIN SODIUM 60 MG/0.6ML ~~LOC~~ SOLN: 50.0000 mg | SUBCUTANEOUS | Status: DC

## 2021-01-12 MED ORDER — IBUPROFEN 800 MG PO TABS
800.0000 mg | ORAL_TABLET | Freq: Three times a day (TID) | ORAL | Status: DC | PRN
  Administered 2021-01-12: 800 mg via ORAL
  Filled 2021-01-12: qty 1

## 2021-01-12 MED ORDER — TIOTROPIUM BROMIDE MONOHYDRATE 18 MCG IN CAPS: 18.0000 ug | ORAL_CAPSULE | Freq: Every day | RESPIRATORY_TRACT | Status: DC

## 2021-01-12 MED ORDER — ENOXAPARIN SODIUM 60 MG/0.6ML ~~LOC~~ SOLN
55.0000 mg | SUBCUTANEOUS | Status: DC
  Administered 2021-01-12 – 2021-01-13 (×2): 55 mg via SUBCUTANEOUS
  Filled 2021-01-12 (×2): qty 0.6

## 2021-01-12 MED ORDER — UMECLIDINIUM BROMIDE 62.5 MCG/INH IN AEPB
1.0000 | INHALATION_SPRAY | Freq: Every day | RESPIRATORY_TRACT | Status: DC
  Administered 2021-01-12 – 2021-01-14 (×3): 1 via RESPIRATORY_TRACT
  Filled 2021-01-12: qty 7

## 2021-01-12 MED ORDER — POTASSIUM CHLORIDE CRYS ER 20 MEQ PO TBCR
20.0000 meq | EXTENDED_RELEASE_TABLET | Freq: Every day | ORAL | Status: DC
  Administered 2021-01-13 – 2021-01-14 (×2): 20 meq via ORAL
  Filled 2021-01-12 (×2): qty 1

## 2021-01-12 MED ORDER — IBUPROFEN 800 MG PO TABS
800.0000 mg | ORAL_TABLET | Freq: Two times a day (BID) | ORAL | Status: DC
  Administered 2021-01-12 – 2021-01-14 (×4): 800 mg via ORAL
  Filled 2021-01-12 (×4): qty 1

## 2021-01-12 NOTE — Progress Notes (Signed)
Patient stated that he is not ready for his CPAP yet. Informed patient to call when he is ready.

## 2021-01-12 NOTE — TOC Initial Note (Signed)
Transition of Care [facility]) - Initial/Assessment Note    Patient Details  Name: Stephen Roberson MRN: [id number] Date of Birth: 05/15/1946  [facility]) CM/SW Contact:    [name], RN Phone Number: 01/12/2021, 11:10 AM  Clinical Narrative:                 Patient from home with wife and caregivers.   PCP is DR Newt Lukes fax [phone] at [place]. [facility] social worker is [name] 336 [phone] ext [phone].  NCM called [name] and faxed clinicals to DR [facility] fax [phone].   NCM also called [facility] notification line ref number [id number].  Will continue to follow for any discharge needs.   Expected Discharge Plan: Home w Home Health Services     Patient Goals and CMS Choice Patient states their goals for this hospitalization and ongoing recovery are:: to return to home CMS Medicare.gov Compare Post Acute Care list provided to:: Patient Represenative (must comment) (wife [name])    Expected Discharge Plan and Services Expected Discharge Plan: Home w Home Health Services   Discharge Planning Services: CM Consult   Living arrangements for the past 2 months: Single Family Home                 DME Arranged: N/A DME Agency: NA       HH Arranged: NA          Prior Living Arrangements/Services Living arrangements for the past 2 months: Single Family Home Lives with:: Spouse Patient language and need for interpreter reviewed:: Yes Do you feel safe going back to the place where you live?: Yes      Need for Family Participation in Patient Care: Yes (Comment) Care giver support system in place?: Yes (comment) Current home services: DME Criminal Activity/Legal Involvement Pertinent to Current Situation/Hospitalization: No - Comment as needed  Activities of Daily Living      Permission Sought/Granted   Permission granted to share information with : Yes, Verbal Permission Granted  Share Information with NAME: wife [name]           Emotional  Assessment Appearance:: Appears stated age            Admission diagnosis:  Bradycardia [R00.1] Patient Active Problem List   Diagnosis Date Noted  . Bradycardia 01/11/2021  . Hypertension 01/11/2021   [facility]P:  [facility] Pharmacy:   [facility] - [place], [place] - [address] [place] [address] Phone: [phone] Fax: [phone]     Social Determinants of Health (SDOH) Interventions    Readmission Risk Interventions No flowsheet data found.

## 2021-01-12 NOTE — Progress Notes (Signed)
Patient placed on CPAP for hs using FFM as per home regimen.  After speaking to wife (patient is pleasantly confused currently and at baseline) auto titration mode used as home settings are unknown.  Room air used.  Wife states that patient would be able to remove mask in the case of emergency.  Patient cooperative and comfortable with mask fit and pressure.  RN at bedside and aware.

## 2021-01-12 NOTE — Progress Notes (Signed)
  Echocardiogram 2D Echocardiogram has been performed.  Jaice Lague G Gwynn Crossley 01/12/2021, 1:48 PM

## 2021-01-12 NOTE — Progress Notes (Signed)
Subjective:   Overnight, no acute events.  This morning, patient reports that he has some abdominal discomfort, otherwise he has no new complaints or concerns. He is unable to provide further interval history.  Objective:  Vital signs in last 24 hours: Vitals:   01/12/21 0444 01/12/21 0805 01/12/21 1158 01/12/21 1248  BP: (!) 150/86 (!) 180/96 (!) 171/96   Pulse: 60 66 66 74  Resp: 16 18 18 16   Temp: 98.6 F (37 C) 98.1 F (36.7 C) 98.9 F (37.2 C)   TempSrc: Axillary Oral Oral   SpO2: 96% 94% 96% 96%  Weight:      Height:      On room air  Intake/Output Summary (Last 24 hours) at 01/12/2021 1532 Last data filed at 01/12/2021 0100 Gross per 24 hour  Intake 240 ml  Output 650 ml  Net -410 ml   Filed Weights   01/11/21 1428 01/11/21 2300  Weight: 107.5 kg 109.9 kg   Labs: CBC Latest Ref Rng & Units 01/12/2021 01/11/2021  WBC 4.0 - 10.5 K/uL 11.9(H) 10.9(H)  Hemoglobin 13.0 - 17.0 g/dL 01/13/2021 40.3  Hematocrit 47.4 - 52.0 % 41.4 40.6  Platelets 150 - 400 K/uL 245 267   CMP Latest Ref Rng & Units 01/12/2021 01/11/2021  Glucose 70 - 99 mg/dL 01/13/2021) 563(O)  BUN 8 - 23 mg/dL 13 17  Creatinine 756(E - 1.24 mg/dL 3.32 9.51  Sodium 8.84 - 145 mmol/L 137 139  Potassium 3.5 - 5.1 mmol/L 3.3(L) 4.3  Chloride 98 - 111 mmol/L 103 103  CO2 22 - 32 mmol/L 27 30  Calcium 8.9 - 10.3 mg/dL 9.0 9.2  Total Protein 6.5 - 8.1 g/dL - 7.0  Total Bilirubin 0.3 - 1.2 mg/dL - 0.7  Alkaline Phos 38 - 126 U/L - 85  AST 15 - 41 U/L - 28  ALT 0 - 44 U/L - 35   TSH - 0.802  Imaging: ECHOCARDIOGRAM COMPLETE  Result Date: 01/12/2021    ECHOCARDIOGRAM REPORT   Patient Name:   Stephen Roberson Date of Exam: 01/12/2021 Medical Rec #:  01/14/2021       Height:       64.0 in Accession #:    [id number]      Weight:       242.3 lb Date of Birth:  [birth year]        BSA:          2.123 m Patient Age:    74 years        BP:           171/96 mmHg Patient Gender: M               HR:           70 bpm. Exam  Location:  Inpatient Procedure: 2D Echo, Cardiac Doppler and Color Doppler Indications:    I50.22 Chronic systolic (congestive) heart failure  History:        Patient has no prior history of Echocardiogram examinations.                 CAD, COPD; Risk Factors:Hypertension and Diabetes. Thyroid                 Disease.  Sonographer:    07/05/1946 Dance Referring Phys: [id number] ELIZABETH REES IMPRESSIONS  1. Left ventricular ejection fraction, by estimation, is 55 to 60%. The left ventricle has normal function. The left ventricle has no regional wall motion abnormalities. There is  mild concentric left ventricular hypertrophy. Left ventricular diastolic parameters are consistent with Grade I diastolic dysfunction (impaired relaxation).  2. Right ventricular systolic function is normal. The right ventricular size is normal.  3. Left atrial size was mildly dilated.  4. The mitral valve is normal in structure. No evidence of mitral valve regurgitation. No evidence of mitral stenosis.  5. The aortic valve is normal in structure. Aortic valve regurgitation is not visualized. No aortic stenosis is present.  6. The inferior vena cava is normal in size with greater than 50% respiratory variability, suggesting right atrial pressure of 3 mmHg. FINDINGS  Left Ventricle: Left ventricular ejection fraction, by estimation, is 55 to 60%. The left ventricle has normal function. The left ventricle has no regional wall motion abnormalities. The left ventricular internal cavity size was normal in size. There is  mild concentric left ventricular hypertrophy. Left ventricular diastolic parameters are consistent with Grade I diastolic dysfunction (impaired relaxation). Normal left ventricular filling pressure. Right Ventricle: The right ventricular size is normal. No increase in right ventricular wall thickness. Right ventricular systolic function is normal. Left Atrium: Left atrial size was mildly dilated. Right Atrium: Right atrial size was  normal in size. Pericardium: There is no evidence of pericardial effusion. Mitral Valve: The mitral valve is normal in structure. No evidence of mitral valve regurgitation. No evidence of mitral valve stenosis. Tricuspid Valve: The tricuspid valve is normal in structure. Tricuspid valve regurgitation is not demonstrated. No evidence of tricuspid stenosis. Aortic Valve: The aortic valve is normal in structure. Aortic valve regurgitation is not visualized. Aortic regurgitation PHT measures 325 msec. No aortic stenosis is present. Pulmonic Valve: The pulmonic valve was normal in structure. Pulmonic valve regurgitation is mild. No evidence of pulmonic stenosis. Aorta: The aortic root is normal in size and structure. Venous: The inferior vena cava is normal in size with greater than 50% respiratory variability, suggesting right atrial pressure of 3 mmHg. IAS/Shunts: No atrial level shunt detected by color flow Doppler.  LEFT VENTRICLE PLAX 2D LVIDd:         5.30 cm  Diastology LVIDs:         3.70 cm  LV e' medial:    6.71 cm/s LV PW:         1.40 cm  LV E/e' medial:  8.7 LV IVS:        1.30 cm  LV e' lateral:   8.21 cm/s LVOT diam:     2.20 cm  LV E/e' lateral: 7.1 LV SV:         96 LV SV Index:   45 LVOT Area:     3.80 cm  RIGHT VENTRICLE             IVC RV Basal diam:  3.20 cm     IVC diam: 2.00 cm RV S prime:     13.40 cm/s TAPSE (M-mode): 2.1 cm LEFT ATRIUM             Index       RIGHT ATRIUM           Index LA diam:        4.20 cm 1.98 cm/m  RA Area:     13.90 cm LA Vol (A2C):   48.2 ml 22.71 ml/m RA Volume:   29.40 ml  13.85 ml/m LA Vol (A4C):   63.6 ml 29.96 ml/m LA Biplane Vol: 57.4 ml 27.04 ml/m  AORTIC VALVE LVOT Vmax:   132.00 cm/s LVOT Vmean:  79.500 cm/s LVOT VTI:    0.253 m AI PHT:      325 msec  AORTA Ao Root diam: 4.20 cm Ao Asc diam:  3.70 cm MITRAL VALVE MV Area (PHT): 2.48 cm    SHUNTS MV Decel Time: 306 msec    Systemic VTI:  0.25 m MV E velocity: 58.50 cm/s  Systemic Diam: 2.20 cm MV A  velocity: 85.50 cm/s MV E/A ratio:  0.68 Mihai Croitoru MD Electronically signed by Thurmon Fair MD Signature Date/Time: 01/12/2021/2:46:21 PM    Final    Assessment/Plan:  Principal Problem:   Bradycardia Active Problems:   Hypertension   Dementia (HCC)   Obesity  Stephen Roberson is a 58 year old woman with past medical history significant for dementia, HTN, chronic bradycardia and obesity who presented to [facility] on 01/11/21 for evaluation of bradycardia and hypotension.  #Sinus bradycardia, resolved #HTN, chronic Patient's heart rate improved, most recently to 72 from 38 on admission, following cessation of metoprolol 75mg  twice daily and diltiazem 240mg  twice daily. Review of patient's home blood pressure and heart rate readings reveals that patient's heart rate ranges from low 40s to high 50s at baseline with blood pressure readings as low as 90/70. Patient's sinus bradycardia likely iatrogenic in the setting of two antihypertensive medications with AV nodal blocking effects as well as donepezil and memantine both of which may be contributing to his bradycardia. Patient would benefit from modification of his antihypertensive regimen, however patient will require continued close follow-up for further adjustments following discharge. -Start chlorthalidone 25mg  daily -Continue losartan 100mg  daily -Continue furosemide 40mg  twice daily -Continue home memantine and donepezil -Holding home metoprolol 75mg  twice daily -Holding home diltiazem 240mg  twice daily -Holding home isosorbide 60mg  twice daily -Continue telemetry  #Code status: DNR #Diet: Heart healthy #VTE ppx: enoxaparin 55mg  daily #Bowel regimen: Senna two tablets daily PRN  Prior to Admission Living Arrangement: Home Anticipated Discharge Location: Home Barriers to Discharge: Continued medical management Dispo: Anticipated discharge in approximately 1-2 day(s).   , MD 01/12/2021, 3:32 PM Pager:  [phone] After 5pm on weekdays and 1pm on weekends: On Call pager [phone]

## 2021-01-13 DIAGNOSIS — E669 Obesity, unspecified: Secondary | ICD-10-CM

## 2021-01-13 DIAGNOSIS — I10 Essential (primary) hypertension: Secondary | ICD-10-CM

## 2021-01-13 DIAGNOSIS — R001 Bradycardia, unspecified: Secondary | ICD-10-CM

## 2021-01-13 LAB — BASIC METABOLIC PANEL
Anion gap: 9 (ref 5–15)
BUN: 14 mg/dL (ref 8–23)
CO2: 25 mmol/L (ref 22–32)
Calcium: 8.9 mg/dL (ref 8.9–10.3)
Chloride: 103 mmol/L (ref 98–111)
Creatinine, Ser: 0.78 mg/dL (ref 0.61–1.24)
GFR, Estimated: >60 mL/min (ref >60)
Glucose, Bld: 115 mg/dL — ABNORMAL HIGH (ref 70–99)
Potassium: 4.2 mmol/L (ref 3.5–5.1)
Sodium: 137 mmol/L (ref 135–145)

## 2021-01-13 LAB — CBC
HCT: 42.8 % (ref 39.0–52.0)
Hemoglobin: 14.9 g/dL (ref 13.0–17.0)
MCH: 33.3 pg (ref 26.0–34.0)
MCHC: 34.8 g/dL (ref 30.0–36.0)
MCV: 95.5 fL (ref 80.0–100.0)
Platelets: 265 10*3/uL (ref 150–400)
RBC: 4.48 MIL/uL (ref 4.22–5.81)
RDW: 12.1 % (ref 11.5–15.5)
WBC: 11.8 10*3/uL — ABNORMAL HIGH (ref 4.0–10.5)
nRBC: 0 % (ref 0.0–0.2)

## 2021-01-13 LAB — GLUCOSE, CAPILLARY: Glucose-Capillary: 110 mg/dL — ABNORMAL HIGH (ref 70–99)

## 2021-01-13 MED ORDER — AMLODIPINE BESYLATE 5 MG PO TABS
5.0000 mg | ORAL_TABLET | Freq: Every day | ORAL | Status: DC
  Administered 2021-01-13 – 2021-01-14 (×2): 5 mg via ORAL
  Filled 2021-01-13 (×2): qty 1

## 2021-01-13 MED ORDER — CITALOPRAM HYDROBROMIDE 20 MG PO TABS
20.0000 mg | ORAL_TABLET | Freq: Every day | ORAL | Status: DC
  Administered 2021-01-14: 20 mg via ORAL
  Filled 2021-01-13: qty 1

## 2021-01-13 NOTE — Progress Notes (Signed)
Subjective:   Overnight, patient declined to wear CPAP  This morning, patient initially wearing CPAP on arrival. Patient's CPAP removed and patient initially difficult to arouse, however patient became responsive to verbal stimuli throughout duration of encounter. Patient reports that he did not sleep well overnight, otherwise he feels well and denies any new complaints or concerns. Patient's wife at bedside reports that patient had endorsed abdominal pain yesterday, however she states that it is unclear whether his symptoms are concerning due to his underlying dementia and inability to consistently report how he feels. Additionally, she reports that he seems much more tired and confused than his baseline at home. We discussed the plan of care for Stephen Roberson moving forward and the importance of close continued follow-up.  Objective:  Vital signs in last 24 hours: Vitals:   01/12/21 2314 01/13/21 0522 01/13/21 0731 01/13/21 1216  BP: (!) 187/92 (!) 155/88  124/84  Pulse: 85 76  79  Resp: 16 18  18   Temp: 98.5 F (36.9 C) 99.5 F (37.5 C)  98.7 F (37.1 C)  TempSrc: Axillary Oral  Oral  SpO2: 94% 93% 94% 98%  Weight:      Height:      On CPAP  Intake/Output Summary (Last 24 hours) at 01/13/2021 1320 Last data filed at 01/12/2021 2346 Gross per 24 hour  Intake 360 ml  Output 900 ml  Net -540 ml   Filed Weights   01/11/21 1428 01/11/21 2300  Weight: 107.5 kg 109.9 kg   Physical Exam Vitals and nursing note reviewed.  Constitutional:      Comments: Elderly man, supine in inclined hospital bed wearing CPAP. Patient resting comfortably with eyes closed but opens eyes to verbal stimulation. Patient able to communicate appropriately to basic questions.  Cardiovascular:     Rate and Rhythm: Normal rate and regular rhythm.     Pulses: Normal pulses.     Heart sounds: Normal heart sounds.  Pulmonary:     Effort: Pulmonary effort is normal.     Breath sounds: Normal breath sounds.   Abdominal:     General: Abdomen is flat. Bowel sounds are normal.     Palpations: Abdomen is soft.     Tenderness: There is no abdominal tenderness.  Musculoskeletal:     Right lower leg: No edema.     Left lower leg: No edema.  Skin:    General: Skin is warm and dry.    Labs: CBC Latest Ref Rng & Units 01/13/2021 01/12/2021 01/11/2021  WBC 4.0 - 10.5 K/uL 11.8(H) 11.9(H) 10.9(H)  Hemoglobin 13.0 - 17.0 g/dL 01/13/2021 14.7 82.9  Hematocrit 39.0 - 52.0 % 42.8 41.4 40.6  Platelets 150 - 400 K/uL 265 245 267   CMP Latest Ref Rng & Units 01/13/2021 01/12/2021 01/11/2021  Glucose 70 - 99 mg/dL 01/13/2021) 130(Q) 657(Q)  BUN 8 - 23 mg/dL 14 13 17   Creatinine 0.61 - 1.24 mg/dL 469(G 2.95  Sodium 135 - 145 mmol/L 137 137 139  Potassium 3.5 - 5.1 mmol/L 4.2 3.3(L) 4.3  Chloride 98 - 111 mmol/L 103 103 103  CO2 22 - 32 mmol/L 25 27 30   Calcium 8.9 - 10.3 mg/dL 8.9 9.0 9.2  Total Protein 6.5 - 8.1 g/dL - - 7.0  Total Bilirubin 0.3 - 1.2 mg/dL - - 0.7  Alkaline Phos 38 - 126 U/L - - 85  AST 15 - 41 U/L - - 28  ALT 0 - 44 U/L - - 35  Imaging: ECHOCARDIOGRAM COMPLETE  Result Date: 01/12/2021 IMPRESSIONS  1. Left ventricular ejection fraction, by estimation, is 55 to 60%. The left ventricle has normal function. The left ventricle has no regional wall motion abnormalities. There is mild concentric left ventricular hypertrophy. Left ventricular diastolic parameters are consistent with Grade I diastolic dysfunction (impaired relaxation).  2. Right ventricular systolic function is normal. The right ventricular size is normal.  3. Left atrial size was mildly dilated.  4. The mitral valve is normal in structure. No evidence of mitral valve regurgitation. No evidence of mitral stenosis.  5. The aortic valve is normal in structure. Aortic valve regurgitation is not visualized. No aortic stenosis is present.  6. The inferior vena cava is normal in size with greater than 50% respiratory variability, suggesting  right atrial pressure of 3 mmHg.  Assessment/Plan:  Principal Problem:   Bradycardia Active Problems:   Hypertension   Dementia (HCC)   Obesity  Stephen Roberson is a 75 year old man with past medical history significant for Alzheimer's dementia, HTN, chronic bradycardia and obesity who presented to [facility] on 01/11/21 for evaluation of sinus bradycardia and hypotension, both of which have improved since admission however his hospitalization now complicated by the development of hypoactive delirium.  #Sinus bradycardia, resolved #HTN, chronic Patient's heart rate well controlled from 74-85 throughout past 24 hours. Patient's blood pressure continues to improve, most recently 124/84 on current regimen. Patient's morning basic metabolic panel does not indicate electrolyte derangement or kidney injury. Patient would benefit from continuation of current medication regimen with close follow-up in the outpatient setting for further antihypertensive adjustments based on daily blood pressure recordings at home by his home health nurse. -Continue chlorthalidone 25mg  daily -Continue losartan 100mg  daily -Continue furosemide 40mg  twice daily -Continue home memantine and donepezil -Holding home metoprolol 75mg  twice daily -Holding home diltiazem 240mg  twice daily -Holding home isosorbide 60mg  twice daily -Continue telemetry  #Hypoactive delirium, new, active Patient has developed hypoactive delirium overnight. Patient has underlying Alzheimer's dementia. We will support patient during his hospitalization with frequent reorientation with the help of his wife and home health nurse as well as engage in delirium precautions. Patient is expected to have significant improvement in this condition in the coming hours to days while remaining hospitalized with additional improvement upon discharging home. -Delirium precautions -Appreciate family support at bedside  #Obstructive sleep apnea, chronic Patient has  OSA requiring nightly CPAP. Patient refused to wear CPAP overnight, however mask was placed this morning on his wife's arrival. It is essential that patient wears his CPAP nightly. -Continue CPAP nightly  #Code status: DNR #Diet: Heart healthy #VTE ppx: enoxaparin 55mg  daily #Bowel regimen: Senna two tablets daily PRN  Prior to Admission Living Arrangement: Home Anticipated Discharge Location: Home Barriers to Discharge: Continued medical management Dispo: Anticipated discharge tomorrow  , MD 01/13/2021, 1:20 PM Pager: [phone] After 5pm on weekdays and 1pm on weekends: On Call pager [phone]

## 2021-01-13 NOTE — Discharge Summary (Signed)
Name: Stephen Roberson MRN: [id number] DOB: [birth year] 75 y.o. PCP: [facility]  Date of Admission: 01/11/2021  1:46 PM Date of Discharge: 01/14/2021 Attending Physician: No att. providers found  Discharge Diagnosis: 1. Principal Problem:   Bradycardia Active Problems:   Hypertension   Dementia (HCC)   Obesity  Discharge Medications: Allergies as of 01/14/2021       Reactions   Clonidine Derivatives Anaphylaxis   Iodinated Diagnostic Agents Anaphylaxis   Lipitor [atorvastatin] Anaphylaxis   Proventil [albuterol] Anaphylaxis   Valium [diazepam] Anaphylaxis        Medication List     STOP taking these medications    diltiazem 240 MG 24 hr capsule Commonly known as: TIAZAC   isosorbide mononitrate 60 MG 24 hr tablet Commonly known as: IMDUR   metoprolol tartrate 50 MG tablet Commonly known as: LOPRESSOR       TAKE these medications    acetaminophen 500 MG tablet Commonly known as: TYLENOL Take 1,000 mg by mouth in the morning and at bedtime.   amLODipine 10 MG tablet Commonly known as: NORVASC Take 1 tablet (10 mg total) by mouth daily. Start taking on: January 15, 2021   ammonium lactate 12 % lotion Commonly known as: LAC-HYDRIN Apply 1 application topically daily.   Aquaphilic/Carbamide 10 % Oint Apply 1 application topically in the morning and at bedtime.   aspirin 81 MG EC tablet Take 81 mg by mouth daily. Swallow whole.   citalopram 40 MG tablet Commonly known as: CELEXA Take 40 mg by mouth daily.   docusate sodium 250 MG capsule Commonly known as: COLACE Take 250 mg by mouth 2 (two) times daily.   donepezil 5 MG tablet Commonly known as: ARICEPT Take 10 mg by mouth at bedtime.   ferrous sulfate 325 (65 FE) MG EC tablet Take 325 mg by mouth in the morning and at bedtime.   furosemide 40 MG tablet Commonly known as: LASIX Take 40 mg by mouth 2 (two) times daily.   ibuprofen 800 MG tablet Commonly known as: ADVIL Take 800 mg  by mouth every 8 (eight) hours as needed for mild pain.   ipratropium 0.06 % nasal spray Commonly known as: ATROVENT Place 2 sprays into both nostrils 4 (four) times daily.   ketorolac 0.5 % ophthalmic solution Commonly known as: ACULAR Place 1 drop into both eyes 4 (four) times daily as needed (itching eyes).   loratadine 10 MG tablet Commonly known as: CLARITIN Take 10 mg by mouth daily.   losartan 100 MG tablet Commonly known as: COZAAR Take 100 mg by mouth daily.   memantine 10 MG tablet Commonly known as: NAMENDA Take 20 mg by mouth at bedtime.   mirtazapine 7.5 MG tablet Commonly known as: REMERON Take 7.5 mg by mouth at bedtime.   MULTIVITAMIN PO Take 1 tablet by mouth in the morning and at bedtime. PropaxGold with NTFactor Plus Reveratrol & Co Q10 Vitamins: (Multi-vitamin, Multi-Mineral, Antioxidant & Pre/Probiotic Complexes Plus NTFactor   mupirocin ointment 2 % Commonly known as: BACTROBAN Apply 1 application topically 2 (two) times daily as needed (rash).   nitroGLYCERIN 0.4 MG SL tablet Commonly known as: NITROSTAT Place 0.4 mg under the tongue every 5 (five) minutes as needed for chest pain.   omeprazole 20 MG capsule Commonly known as: PRILOSEC Take 20 mg by mouth 2 (two) times daily before a meal.   potassium chloride 10 MEQ tablet Commonly known as: KLOR-CON Take 10-20 mEq by mouth 2 (two)  times daily. Take 2 tablets (20 meq) in the morning and Take 1 tablet (10 meq) at bedtime   QUEtiapine 25 MG tablet Commonly known as: SEROQUEL Take 25 mg by mouth at bedtime.   rizatriptan 10 MG tablet Commonly known as: MAXALT Take 10 mg by mouth every 2 (two) hours as needed for migraine. May repeat in 2 hours if needed   rosuvastatin 10 MG tablet Commonly known as: CRESTOR Take 10 mg by mouth at bedtime.   sennosides-docusate sodium 8.6-50 MG tablet Commonly known as: SENOKOT-S Take 2 tablets by mouth daily as needed for constipation.   sulfaSALAzine  500 MG tablet Commonly known as: AZULFIDINE Take 500 mg by mouth 2 (two) times daily.   triamcinolone 0.1 % Commonly known as: KENALOG Apply 1 application topically daily.   triamcinolone 0.1%-silver sulfadiazine 1:1 cream mixture Apply 1 application topically daily.       Disposition and follow-up:   [name] was discharged from [facility] in Stable condition.  At the hospital follow up visit please address:  1.  Bradycardia: Patient was admitted for sinus bradycardia (HR of 37) and hypotension (BP 80/40) in the setting of metoprolol  BID, diltiazem  BID, and donepezil  nightly. Patient's metoprolol, diltiazem and isosorbide mononitrate discontinued during hospitalization and patient transitioned to amlodipine  (new medication) with continuation of furosemide  BID and losartan  daily. Please assess patient's BP and heart rate log on current regimen. Consider adjusting antihypertensive regimen with non-AV nodal blocking agents. Consider obtaining basic metabolic panel.  2.  Labs / imaging needed at time of follow-up: BMP  3.  Pending labs/ test needing follow-up: None  Follow-up Appointments:  Follow-up Information     [facility]. Go to.   Specialty: General Practice Contact information: [address] [place] [address] [phone]                 Hospital Course by problem list: 1. Sinus bradycardia - Patient presented for heart rate of 37. Patient's metoprolol and diltiazem were discontinued with improvement in patient's heart rate to 70s-80s for 24 hours prior to discharge.  2. Hypertension - Patient presented hypotensive to 80/40. Patient's metoprolol, diltiazem and isosorbide mononitrate were discontinued with elevation in his blood pressure upwards of 180s systolic. Patient started on chlorthalidone  daily with discontinuation of furosemide  twice daily, however patient's family  preferred continuation of furosemide with consideration of alternative antihypertensive agent. Patient's chlorthalidone was discontinued and patient discharged home on amlodipine  daily.  3. Hypoactive delirium - Day prior to discharge, patient noted to be more somnolent, confused and difficult to arouse. He did not wear CPAP throughout the night prior to this delirium. Patient placed on delirium precautions and CPAP worn night prior to discharge. On day of discharge, patient's hypoactive delirium had resolved.  4. Depression - Patient's citalopram was recommended to be decreased to  from  daily to reduce risk of QT prolongation. Patient discharged with  citalopram daily with plan to continue considering dosage reduction in outpatient setting.  Pertinent Labs, Studies, and Procedures: CBC Latest Ref Rng & Units 01/14/2021 01/13/2021 01/12/2021  WBC 4.0 - 10.5 K/uL 11.9(H) 11.8(H) 11.9(H)  Hemoglobin 13.0 - 17.0 g/dL 82.9 56.2 13.0  Hematocrit 39.0 - 52.0 % 42.5 42.8 41.4  Platelets 150 - 400 K/uL 261 265 245   CMP Latest Ref Rng & Units 01/14/2021 01/13/2021 01/12/2021  Glucose 70 - 99 mg/dL 865(H) 846(N) 629(B)  BUN 8 -  23 mg/dL 21 14 13   Creatinine 0.61 - 1.24 mg/dL 5.631.16 8.750.78 6.430.71  Sodium 135 - 145 mmol/L 136 137 137  Potassium 3.5 - 5.1 mmol/L 3.5 4.2 3.3(L)  Chloride 98 - 111 mmol/L 102 103 103  CO2 22 - 32 mmol/L 26 25 27   Calcium 8.9 - 10.3 mg/dL 8.9 8.9 9.0  Total Protein 6.5 - 8.1 g/dL - - -  Total Bilirubin 0.3 - 1.2 mg/dL - - -  Alkaline Phos 38 - 126 U/L - - -  AST 15 - 41 U/L - - -  ALT 0 - 44 U/L - - -  DG Chest Port 1 View  Result Date: 01/11/2021 CLINICAL DATA:  Chest pain EXAM: PORTABLE CHEST 1 VIEW COMPARISON:  None. FINDINGS: Low lung volumes. Mild interstitial prominence. Enlargement of the cardiomediastinal silhouette. No significant pleural effusion. No pneumothorax. IMPRESSION: Mild interstitial prominence. This is age-indeterminate and could be chronic or  reflect mild interstitial edema or atypical/viral pneumonia. Enlargement of the cardiomediastinal silhouette with probable mild cardiomegaly. Electronically Signed   By: Guadlupe SpanishPraneil  Patel M.D.   On: 01/11/2021 14:25   ECHOCARDIOGRAM COMPLETE  Result Date: 01/12/2021    ECHOCARDIOGRAM REPORT   Patient Name:   Stephen Roberson Date of Exam: 01/12/2021 Medical Rec #:  [id number]       Height:       64.0 in Accession #:    [id number]      Weight:       242.3 lb Date of Birth:  12/01/1945        BSA:          2.123 m Patient Age:    74 years        BP:           171/96 mmHg Patient Gender: M               HR:           70 bpm. Exam Location:  Inpatient Procedure: 2D Echo, Cardiac Doppler and Color Doppler Indications:    I50.22 Chronic systolic (congestive) heart failure  History:        Patient has no prior history of Echocardiogram examinations.                 CAD, COPD; Risk Factors:Hypertension and Diabetes. Thyroid                 Disease.  Sonographer:    Elmarie Shileyiffany Dance Referring Phys: [id number] ELIZABETH REES IMPRESSIONS  1. Left ventricular ejection fraction, by estimation, is 55 to 60%. The left ventricle has normal function. The left ventricle has no regional wall motion abnormalities. There is mild concentric left ventricular hypertrophy. Left ventricular diastolic parameters are consistent with Grade I diastolic dysfunction (impaired relaxation).  2. Right ventricular systolic function is normal. The right ventricular size is normal.  3. Left atrial size was mildly dilated.  4. The mitral valve is normal in structure. No evidence of mitral valve regurgitation. No evidence of mitral stenosis.  5. The aortic valve is normal in structure. Aortic valve regurgitation is not visualized. No aortic stenosis is present.  6. The inferior vena cava is normal in size with greater than 50% respiratory variability, suggesting right atrial pressure of 3 mmHg. FINDINGS  Left Ventricle: Left ventricular ejection fraction, by  estimation, is 55 to 60%. The left ventricle has normal function. The left ventricle has no regional wall motion abnormalities. The left ventricular internal cavity size was normal  in size. There is  mild concentric left ventricular hypertrophy. Left ventricular diastolic parameters are consistent with Grade I diastolic dysfunction (impaired relaxation). Normal left ventricular filling pressure. Right Ventricle: The right ventricular size is normal. No increase in right ventricular wall thickness. Right ventricular systolic function is normal. Left Atrium: Left atrial size was mildly dilated. Right Atrium: Right atrial size was normal in size. Pericardium: There is no evidence of pericardial effusion. Mitral Valve: The mitral valve is normal in structure. No evidence of mitral valve regurgitation. No evidence of mitral valve stenosis. Tricuspid Valve: The tricuspid valve is normal in structure. Tricuspid valve regurgitation is not demonstrated. No evidence of tricuspid stenosis. Aortic Valve: The aortic valve is normal in structure. Aortic valve regurgitation is not visualized. Aortic regurgitation PHT measures 325 msec. No aortic stenosis is present. Pulmonic Valve: The pulmonic valve was normal in structure. Pulmonic valve regurgitation is mild. No evidence of pulmonic stenosis. Aorta: The aortic root is normal in size and structure. Venous: The inferior vena cava is normal in size with greater than 50% respiratory variability, suggesting right atrial pressure of 3 mmHg. IAS/Shunts: No atrial level shunt detected by color flow Doppler.  LEFT VENTRICLE PLAX 2D LVIDd:         5.30 cm  Diastology LVIDs:         3.70 cm  LV e' medial:    6.71 cm/s LV PW:         1.40 cm  LV E/e' medial:  8.7 LV IVS:        1.30 cm  LV e' lateral:   8.21 cm/s LVOT diam:     2.20 cm  LV E/e' lateral: 7.1 LV SV:         96 LV SV Index:   45 LVOT Area:     3.80 cm  RIGHT VENTRICLE             IVC RV Basal diam:  3.20 cm     IVC diam:  2.00 cm RV S prime:     13.40 cm/s TAPSE (M-mode): 2.1 cm LEFT ATRIUM             Index       RIGHT ATRIUM           Index LA diam:        4.20 cm 1.98 cm/m  RA Area:     13.90 cm LA Vol (A2C):   48.2 ml 22.71 ml/m RA Volume:   29.40 ml  13.85 ml/m LA Vol (A4C):   63.6 ml 29.96 ml/m LA Biplane Vol: 57.4 ml 27.04 ml/m  AORTIC VALVE LVOT Vmax:   132.00 cm/s LVOT Vmean:  79.500 cm/s LVOT VTI:    0.253 m AI PHT:      325 msec  AORTA Ao Root diam: 4.20 cm Ao Asc diam:  3.70 cm MITRAL VALVE MV Area (PHT): 2.48 cm    SHUNTS MV Decel Time: 306 msec    Systemic VTI:  0.25 m MV E velocity: 58.50 cm/s  Systemic Diam: 2.20 cm MV A velocity: 85.50 cm/s MV E/A ratio:  0.68 Mihai Croitoru MD Electronically signed by Thurmon Fair MD Signature Date/Time: 01/12/2021/2:46:21 PM    Final     Discharge Instructions: Discharge Instructions     Call MD for:  difficulty breathing, headache or visual disturbances   Complete by: As directed    Call MD for:  extreme fatigue   Complete by: As directed    Call  MD for:  hives   Complete by: As directed    Call MD for:  persistant dizziness or light-headedness   Complete by: As directed    Call MD for:  persistant nausea and vomiting   Complete by: As directed    Call MD for:  redness, tenderness, or signs of infection (pain, swelling, redness, odor or green/yellow discharge around incision site)   Complete by: As directed    Call MD for:  severe uncontrolled pain   Complete by: As directed    Call MD for:  temperature >100.4   Complete by: As directed    Diet - low sodium heart healthy   Complete by: As directed    Discharge instructions   Complete by: As directed    Mr. Stettler,  It was an absolute pleasure having the opportunity to care for you during your recent hospitalization.  You were hospitalized due to a condition known as sinus bradycardia (or a slow heart rate) as well as hypotension (low blood pressures). Your heart rate and blood pressure  improved dramatically following modifying your home medication regimen.  We recommend the following changes: -Discontinue metoprolol 75mg  twice daily -Discontinue isosorbide mononitrate 60mg  twice daily -Discontinue diltiazem 240mg  twice daily -Start taking amlodipine 10mg  daily -Continue remainder of medication regimen  We also strongly recommend that you follow-up closely with your PCP, Dr. , for further monitoring of your medication regimen and assess for need for further laboratory monitoring.  Sincerely, Dr. , MD   Increase activity slowly   Complete by: As directed        Signed: , MD 01/14/2021, 1:15 PM   Pager: [phone]

## 2021-01-13 NOTE — Progress Notes (Signed)
RT note- Patient's wife wished for cpap to be placed at this time. Tolerating well.

## 2021-01-14 ENCOUNTER — Other Ambulatory Visit: Payer: Self-pay | Admitting: Student

## 2021-01-14 LAB — BASIC METABOLIC PANEL
Anion gap: 8 (ref 5–15)
BUN: 21 mg/dL (ref 8–23)
CO2: 26 mmol/L (ref 22–32)
Calcium: 8.9 mg/dL (ref 8.9–10.3)
Chloride: 102 mmol/L (ref 98–111)
Creatinine, Ser: 1.16 mg/dL (ref 0.61–1.24)
GFR, Estimated: >60 mL/min (ref >60)
Glucose, Bld: 105 mg/dL — ABNORMAL HIGH (ref 70–99)
Potassium: 3.5 mmol/L (ref 3.5–5.1)
Sodium: 136 mmol/L (ref 135–145)

## 2021-01-14 LAB — CBC
HCT: 42.5 % (ref 39.0–52.0)
Hemoglobin: 14.3 g/dL (ref 13.0–17.0)
MCH: 32.6 pg (ref 26.0–34.0)
MCHC: 33.6 g/dL (ref 30.0–36.0)
MCV: 97 fL (ref 80.0–100.0)
Platelets: 261 10*3/uL (ref 150–400)
RBC: 4.38 MIL/uL (ref 4.22–5.81)
RDW: 12.5 % (ref 11.5–15.5)
WBC: 11.9 10*3/uL — ABNORMAL HIGH (ref 4.0–10.5)
nRBC: 0 % (ref 0.0–0.2)

## 2021-01-14 MED ORDER — AMLODIPINE BESYLATE 10 MG PO TABS: 10.0000 mg | ORAL_TABLET | Freq: Every day | ORAL | 0 refills | Status: AC

## 2021-01-14 MED ORDER — AMLODIPINE BESYLATE 10 MG PO TABS: 10.0000 mg | ORAL_TABLET | Freq: Every day | ORAL | Status: DC

## 2021-01-14 MED ORDER — CITALOPRAM HYDROBROMIDE 40 MG PO TABS: 40.0000 mg | ORAL_TABLET | Freq: Every day | ORAL | Status: DC

## 2021-01-14 NOTE — Progress Notes (Signed)
Patient's son voiced to RN that rounding team is planning on discharge pt today and request that MDs expedite the discharge since he is responsible for transporting pt back at home and have to catch a flight in the evening. RN paged MD with request.

## 2021-01-14 NOTE — Progress Notes (Addendum)
Discharge instruction reviewed with patient/Family. All question answered at this time. Per attending MD, no need for PT eval since wife reported that pt had great support at home and home health nurse.Transportation provided by family.   Sim Boast, RN

## 2021-01-14 NOTE — Progress Notes (Signed)
Subjective:   Overnight, no acute events.  This morning, patient reports that he is feeling well and denies any new symptoms or concerns. He is unable to provide further interval history. We engaged in shared decision making with patient, patient's family and the Internal Medicine Teaching Service to come up with a medication regimen prior to discharge.  Objective:  Vital signs in last 24 hours: Vitals:   01/14/21 0045 01/14/21 0530 01/14/21 0918 01/14/21 1151  BP: 115/78   105/70  Pulse: 80 62  87  Resp: 18 18  16   Temp: 98.9 F (37.2 C) 98.7 F (37.1 C)  98.5 F (36.9 C)  TempSrc: Oral Axillary  Oral  SpO2: 94% 94% 95% 95%  Weight:      Height:      On room air  Intake/Output Summary (Last 24 hours) at 01/14/2021 1201 Last data filed at 01/14/2021 0700 Gross per 24 hour  Intake -  Output 1700 ml  Net -1700 ml   Filed Weights   01/11/21 1428 01/11/21 2300  Weight: 107.5 kg 109.9 kg   Physical Exam Vitals and nursing note reviewed. Exam conducted with a chaperone present.  Constitutional:      General: He is not in acute distress.    Appearance: He is obese. He is not ill-appearing.     Comments: Elderly, obese man sitting upright in inclined hospital bed with eyes open, appropriately tracking individuals in the room and able to engage in basic conversation.  Cardiovascular:     Rate and Rhythm: Normal rate and regular rhythm.     Pulses: Normal pulses.     Heart sounds: Normal heart sounds.  Pulmonary:     Effort: Pulmonary effort is normal.     Breath sounds: Normal breath sounds.  Abdominal:     General: Abdomen is flat. Bowel sounds are normal.     Palpations: Abdomen is soft.     Tenderness: There is no abdominal tenderness.  Musculoskeletal:     Right lower leg: No edema.     Left lower leg: No edema.  Skin:    Capillary Refill: Capillary refill takes less than 2 seconds.  Neurological:     General: No focal deficit present.     Mental Status: He is  alert. Mental status is at baseline.  Psychiatric:        Mood and Affect: Mood normal.        Behavior: Behavior normal.     Labs in last 24 hours: CBC Latest Ref Rng & Units 01/14/2021 01/13/2021 01/12/2021  WBC 4.0 - 10.5 K/uL 11.9(H) 11.8(H) 11.9(H)  Hemoglobin 13.0 - 17.0 g/dL 01/14/2021 86.7 61.9  Hematocrit 39.0 - 52.0 % 42.5 42.8 41.4  Platelets 150 - 400 K/uL 261 265 245   CMP Latest Ref Rng & Units 01/14/2021 01/13/2021 01/12/2021  Glucose 70 - 99 mg/dL 01/14/2021) 326(Z) 124(P)  BUN 8 - 23 mg/dL 21 14 13   Creatinine 0.61 - 1.24 mg/dL 809(X 8.33  Sodium 135 - 145 mmol/L 136 137 137  Potassium 3.5 - 5.1 mmol/L 3.5 4.2 3.3(L)  Chloride 98 - 111 mmol/L 102 103 103  CO2 22 - 32 mmol/L 26 25 27   Calcium 8.9 - 10.3 mg/dL 8.9 8.9 9.0  Total Protein 6.5 - 8.1 g/dL - - -  Total Bilirubin 0.3 - 1.2 mg/dL - - -  Alkaline Phos 38 - 126 U/L - - -  AST 15 - 41 U/L - - -  ALT  0 - 44 U/L - - -   Imaging in last 24 hours: No results found.  Assessment/Plan:  Principal Problem:   Bradycardia Active Problems:   Hypertension   Dementia (HCC)   Obesity  Stephen Roberson is a 75 year old man with past medical history significant for Alzheimer's dementia, HTN, chronic bradycardia and obesity who presented to [facility] on [facility]/11/21 for evaluation of sinus bradycardia and hypotension with hospital course complicated by development of hypoactive delirium.  #Sinus bradycardia, resolved #HTN, chronic, stable Patient's heart rate and blood pressure significantly improved following medication adjustments. Patient would benefit from being on one diuretic. We have recommended discontinuing furosemide with continuation of chlorthalidone, however with shared decision making we will discharge with furosemide 40mg  twice daily and discontinuation of chlorthalidone 25mg  daily. Additionally, we recommend titrating up the amlodipine dose to 10mg  from 5mg . Patient will need close follow-up in the outpatient setting for  further blood pressure and laboratory monitoring. -Increase amlodipine 10mg  daily -Continue losartan 100mg  daily -Continue home donepezil 10mg  daily -Restart home furosemide 40mg  twice daily -Discontinue chlorthalidone 25mg  daily -Discontinued home metoprolol 75mg  twice daily -Discontinued home diltiazem 240mg  twice daily -Discontinued home isosorbide 60mg  twice daily  #Hypoactive delirium, resolved Patient appears back to baseline this morning with family at bedside and windows and lights on. -Delirium precautions -Appreciate family support at bedside  #Obstructive sleep apnea, chronic Patient wore CPAP last night without complication. -Continue CPAP nightly  #Depression, chronic We discussed with family our recommendation to titrate citalopram down to 20mg  from 40mg  to reduce the risk of QT prolongation, however family requested deferring this modification. -Continue discussing citalopram dosing in outpatient setting  #Code status: DNR #Diet: Heart healthy #VTE ppx: enoxaparin 55mg  daily #Bowel regimen: Senna two tablets daily PRN  Prior to Admission Living Arrangement: Home Anticipated Discharge Location: Home Barriers to Discharge: Continued medical management Dispo: Anticipated discharge today  , MD 01/14/2021, 12:01 PM Pager: [phone] After 5pm on weekdays and 1pm on weekends: On Call pager [phone]

## 2021-01-14 NOTE — Plan of Care (Signed)
  Problem: Safety: Goal: Ability to remain free from injury will improve Outcome: Progressing   

## 2021-09-12 ENCOUNTER — Emergency Department
Admission: EM | Admit: 2021-09-12 | Discharge: 2021-09-12 | Disposition: A | Discharge status: Home / Self Care | Payer: No Typology Code available for payment source | Attending: Emergency Medicine | Admitting: Emergency Medicine

## 2021-09-12 ENCOUNTER — Emergency Department: Payer: No Typology Code available for payment source

## 2021-09-12 DIAGNOSIS — S0990XA Unspecified injury of head, initial encounter: Secondary | ICD-10-CM | POA: Diagnosis present

## 2021-09-12 DIAGNOSIS — Z79899 Other long term (current) drug therapy: Secondary | ICD-10-CM | POA: Insufficient documentation

## 2021-09-12 DIAGNOSIS — Z87891 Personal history of nicotine dependence: Secondary | ICD-10-CM | POA: Insufficient documentation

## 2021-09-12 DIAGNOSIS — I110 Hypertensive heart disease with heart failure: Secondary | ICD-10-CM | POA: Diagnosis not present

## 2021-09-12 DIAGNOSIS — E119 Type 2 diabetes mellitus without complications: Secondary | ICD-10-CM | POA: Diagnosis not present

## 2021-09-12 DIAGNOSIS — J449 Chronic obstructive pulmonary disease, unspecified: Secondary | ICD-10-CM | POA: Diagnosis not present

## 2021-09-12 DIAGNOSIS — W19XXXA Unspecified fall, initial encounter: Secondary | ICD-10-CM

## 2021-09-12 DIAGNOSIS — Z7982 Long term (current) use of aspirin: Secondary | ICD-10-CM | POA: Diagnosis not present

## 2021-09-12 DIAGNOSIS — E039 Hypothyroidism, unspecified: Secondary | ICD-10-CM | POA: Insufficient documentation

## 2021-09-12 DIAGNOSIS — F0390 Unspecified dementia without behavioral disturbance: Secondary | ICD-10-CM | POA: Insufficient documentation

## 2021-09-12 DIAGNOSIS — I509 Heart failure, unspecified: Secondary | ICD-10-CM | POA: Diagnosis not present

## 2021-09-12 DIAGNOSIS — S0003XA Contusion of scalp, initial encounter: Secondary | ICD-10-CM | POA: Insufficient documentation

## 2021-09-12 DIAGNOSIS — I2510 Atherosclerotic heart disease of native coronary artery without angina pectoris: Secondary | ICD-10-CM | POA: Diagnosis not present

## 2021-09-12 DIAGNOSIS — W050XXA Fall from non-moving wheelchair, initial encounter: Secondary | ICD-10-CM | POA: Diagnosis not present

## 2021-09-12 NOTE — ED Notes (Signed)
Pt resting with eyes closed and even respirations.

## 2021-09-12 NOTE — ED Provider Notes (Signed)
[facility] Emergency Department Provider Note   ____________________________________________   Event Date/Time   First MD Initiated Contact with Patient 09/12/21 [id number]     (approximate)  I have reviewed the triage vital signs and the nursing notes.   HISTORY  Chief Complaint Fall    HPI Stephen Roberson is a 75 y.o. male with past medical history of hypertension, diabetes, CAD, CHF, COPD, and dementia who presents to the ED following fall.  History is limited due to patient's dementia, he is not sure why he is here and is only able to answer questions with one-word answers.  Patient arrives via EMS from SNF, per EMS patient fell forward out of his wheelchair striking his head.  He did not lose consciousness and does not take any blood thinners.  Patient currently denies any complaints, is reportedly at his baseline mental status.        Past Medical History:  Diagnosis Date   Arthritis    CHF (congestive heart failure) (HCC)    COPD (chronic obstructive pulmonary disease) (HCC)    Coronary artery disease    Diabetes mellitus without complication (HCC)    Hypertension    Thyroid disease    hypothyroidism    Patient Active Problem List   Diagnosis Date Noted   Dementia (HCC) 01/12/2021   Obesity 01/12/2021   Bradycardia 01/11/2021   Hypertension 01/11/2021    No past surgical history on file.  Prior to Admission medications   Medication Sig Start Date End Date Taking? Authorizing Provider  acetaminophen (TYLENOL) 500 MG tablet Take 1,000 mg by mouth in the morning and at bedtime.    [provider]  amLODipine (NORVASC) 10 MG tablet Take 1 tablet (10 mg total) by mouth Stephen. 01/15/21   Roylene Reason, MD  amLODipine (NORVASC) 10 MG tablet TAKE 1 TABLET (10 MG TOTAL) BY MOUTH Stephen. 01/14/21   Roylene Reason, MD  ammonium lactate (LAC-HYDRIN) 12 % lotion Apply 1 application topically Stephen.    [provider]   aspirin 81 MG EC tablet Take 81 mg by mouth Stephen. Swallow whole.    [provider]  citalopram (CELEXA) 40 MG tablet Take 40 mg by mouth Stephen.    [provider]  docusate sodium (COLACE) 250 MG capsule Take 250 mg by mouth 2 (two) times Stephen.    [provider]  donepezil (ARICEPT) 5 MG tablet Take 10 mg by mouth at bedtime.    [provider]  ferrous sulfate 325 (65 FE) MG EC tablet Take 325 mg by mouth in the morning and at bedtime.    [provider]  furosemide (LASIX) 40 MG tablet Take 40 mg by mouth 2 (two) times Stephen.    [provider]  ibuprofen (ADVIL) 800 MG tablet Take 800 mg by mouth every 8 (eight) hours as needed for mild pain.    [provider]  ipratropium (ATROVENT) 0.06 % nasal spray Place 2 sprays into both nostrils 4 (four) times Stephen.    [provider]  ketorolac (ACULAR) 0.5 % ophthalmic solution Place 1 drop into both eyes 4 (four) times Stephen as needed (itching eyes).    [provider]  loratadine (CLARITIN) 10 MG tablet Take 10 mg by mouth Stephen.    [provider]  losartan (COZAAR) 100 MG tablet Take 100 mg by mouth Stephen.    [provider]  memantine (NAMENDA) 10 MG tablet Take 20 mg by mouth at  bedtime.    [provider]  mirtazapine (REMERON) 7.5 MG tablet Take 7.5 mg by mouth at bedtime.    [provider]  Multiple Vitamin (MULTIVITAMIN PO) Take 1 tablet by mouth in the morning and at bedtime. PropaxGold with NTFactor Plus Reveratrol & Co Q10 Vitamins: (Multi-vitamin, Multi-Mineral, Antioxidant & Pre/Probiotic Complexes Plus NTFactor    [provider]  mupirocin ointment (BACTROBAN) 2 % Apply 1 application topically 2 (two) times Stephen as needed (rash).    [provider]  nitroGLYCERIN (NITROSTAT) 0.4 MG SL tablet Place 0.4 mg under the tongue every 5 (five) minutes as needed for chest pain.    [provider]   omeprazole (PRILOSEC) 20 MG capsule Take 20 mg by mouth 2 (two) times Stephen before a meal.    [provider]  potassium chloride (KLOR-CON) 10 MEQ tablet Take 10-20 mEq by mouth 2 (two) times Stephen. Take 2 tablets (20 meq) in the morning and Take 1 tablet (10 meq) at bedtime    [provider]  QUEtiapine (SEROQUEL) 25 MG tablet Take 25 mg by mouth at bedtime.    [provider]  rizatriptan (MAXALT) 10 MG tablet Take 10 mg by mouth every 2 (two) hours as needed for migraine. May repeat in 2 hours if needed    [provider]  rosuvastatin (CRESTOR) 10 MG tablet Take 10 mg by mouth at bedtime.    [provider]  sennosides-docusate sodium (SENOKOT-S) 8.6-50 MG tablet Take 2 tablets by mouth Stephen as needed for constipation.    [provider]  sulfaSALAzine (AZULFIDINE) 500 MG tablet Take 500 mg by mouth 2 (two) times Stephen.    [provider]  triamcinolone (KENALOG) 0.1 % Apply 1 application topically Stephen.    [provider]  triamcinolone 0.1%-silver sulfadiazine 1:1 cream mixture Apply 1 application topically Stephen.    [provider]  Urea (AQUAPHILIC/CARBAMIDE) 10 % OINT Apply 1 application topically in the morning and at bedtime.    [provider]    Allergies Clonidine derivatives, Iodinated diagnostic agents, Lipitor [atorvastatin], Proventil [albuterol], and Valium [diazepam]  Family History  Problem Relation Age of Onset   Cancer Mother    Cancer Father    Cancer Brother     Social History Social History   Tobacco Use   Smoking status: Former   Smokeless tobacco: Never   Tobacco comments:    quit 20 years ago  Substance Use Topics   Alcohol use: Never   Drug use: Never    Review of Systems  Constitutional: No fever/chills.  Positive for fall. Eyes: No visual changes. ENT: No sore throat. Cardiovascular: Denies chest pain. Respiratory: Denies shortness of  breath. Gastrointestinal: No abdominal pain.  No nausea, no vomiting.  No diarrhea.  No constipation. Genitourinary: Negative for dysuria. Musculoskeletal: Negative for back pain. Skin: Negative for rash. Neurological: Negative for headaches, focal weakness or numbness.  ____________________________________________   PHYSICAL EXAM:  VITAL SIGNS: ED Triage Vitals  Enc Vitals Group     BP 09/12/21 0653 (!) 148/63     Pulse Rate 09/12/21 0653 63     Resp 09/12/21 0653 17     Temp 09/12/21 0653 97.8 F (36.6 C)     Temp Source 09/12/21 0653 Oral     SpO2 09/12/21 0653 96 %     Weight 09/12/21 0656 242 lb 8.1 oz (110 kg)     Height 09/12/21 0656 5\' 4"  (1.626 m)  Head Circumference --      Peak Flow --      Pain Score --      Pain Loc --      Pain Edu? --      Excl. in GC? --     Constitutional: Alert and oriented. Eyes: Conjunctivae are normal. Head: Small hematoma to left frontal scalp with no associated laceration.  No bony step-offs noted. Nose: No congestion/rhinnorhea. Mouth/Throat: Mucous membranes are moist. Neck: Normal ROM, no midline cervical spine tenderness to palpation. Cardiovascular: Normal rate, regular rhythm. Grossly normal heart sounds.  No chest wall tenderness to palpation. Respiratory: Normal respiratory effort.  No retractions. Lungs CTAB. Gastrointestinal: Soft and nontender. No distention. Genitourinary: deferred Musculoskeletal: No lower extremity tenderness nor edema.  No upper extremity bony tenderness to palpation.  Range of motion intact to all 4 extremities. Neurologic:  Normal speech and language. No gross focal neurologic deficits are appreciated. Skin:  Skin is warm, dry and intact. No rash noted. Psychiatric: Mood and affect are normal. Speech and behavior are normal.  ____________________________________________   LABS (all labs ordered are listed, but only abnormal results are displayed)  Labs Reviewed - No data to  display   PROCEDURES  Procedure(s) performed (including Critical Care):  Procedures   ____________________________________________   INITIAL IMPRESSION / ASSESSMENT AND PLAN / ED COURSE      75 year old male with past medical history of hypertension, diabetes, CAD, CHF, COPD, dementia who presents to the ED following witnessed fall forward out of his wheelchair, striking his head.  Patient is now awake and alert with no focal neurologic deficits, is at his baseline mental status and does not take any blood thinners.  We will check CT of his head and cervical spine, but no evidence of trauma to his trunk or extremities.  CT head and cervical spine are negative for acute process, patient remains at his baseline mental status.  He is appropriate for discharge back to nursing facility.      ____________________________________________   FINAL CLINICAL IMPRESSION(S) / ED DIAGNOSES  Final diagnoses:  Fall, initial encounter  Contusion of scalp, initial encounter     ED Discharge Orders     None        Note:  This document was prepared using Dragon voice recognition software and may include unintentional dictation errors.    Chesley Noon, MD 09/12/21 [phone]

## 2021-09-12 NOTE — ED Notes (Signed)
Pt taken for CT 

## 2021-09-12 NOTE — ED Notes (Signed)
RN attempted to give report to [facility]. No response.

## 2021-09-12 NOTE — ED Notes (Signed)
Wife at bedside given update

## 2021-09-12 NOTE — ED Triage Notes (Signed)
Patient from SNF for fall from wheelchair "face first" per EMS; hematoma to left forehead; no history of blood thinners; History of Dementia

## 2022-01-29 DEATH — deceased

## 2023-04-11 IMAGING — CT CT HEAD W/O CM
3 series · 15 of 47 positions shown, 18 images · non-contrast
Comparison: None.

CLINICAL DATA: Neck trauma.  Fall.

EXAM:
CT HEAD WITHOUT CONTRAST
CT CERVICAL SPINE WITHOUT CONTRAST
TECHNIQUE: Multidetector CT imaging of the head and cervical spine was
performed following the standard protocol without intravenous
contrast. Multiplanar CT image reconstructions of the cervical spine
were also generated.

[Series 2: head wo · axial · 0.47mm/px · z∈[-458,-303]mm · 9 of 37 slices shown, 12 images]
[im 3/37  brain]
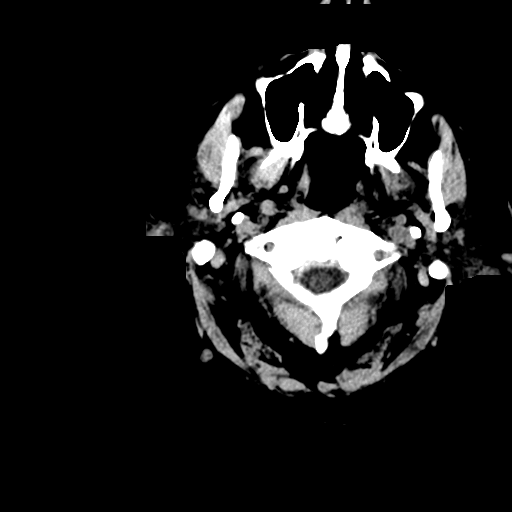
[im 3/37  bone]
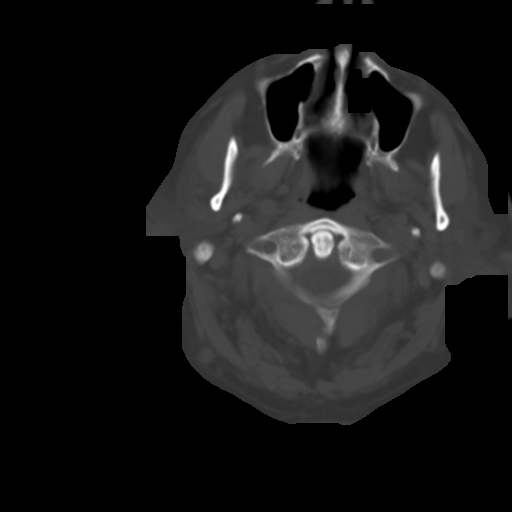
[im 7/37  brain]
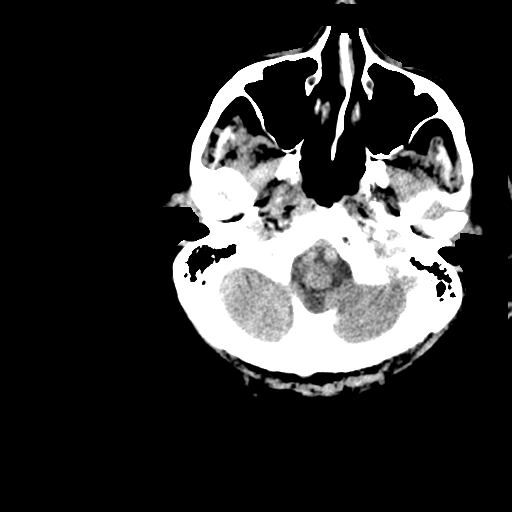
[im 10/37  brain]
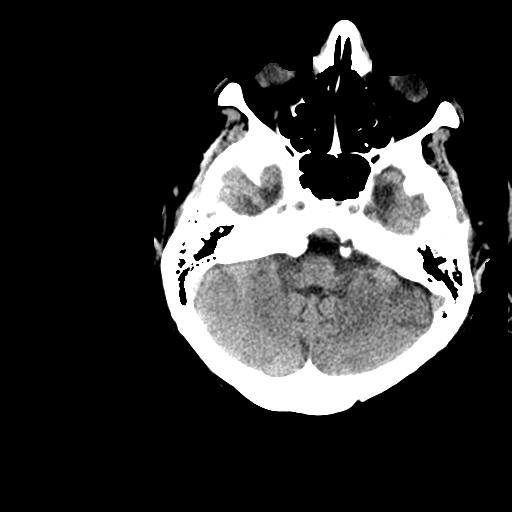
[im 14/37  brain]
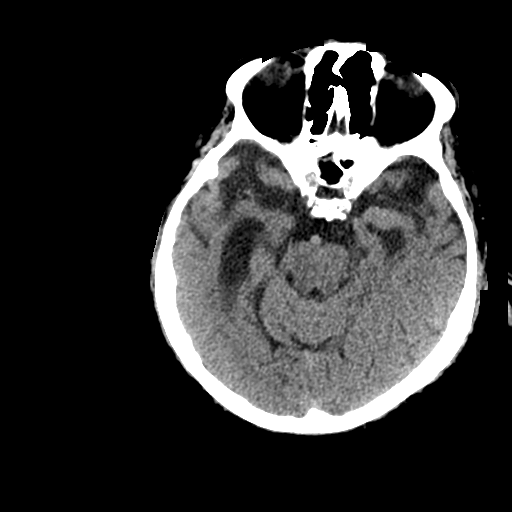
[im 19/37  brain]
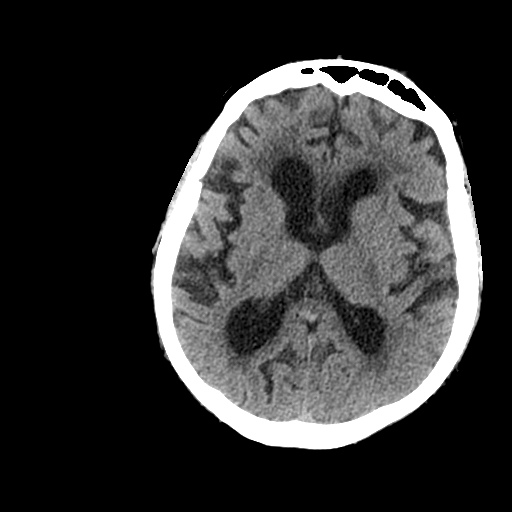
[im 19/37  bone]
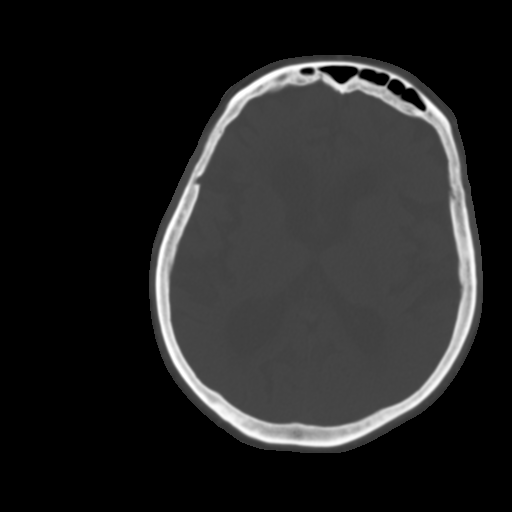
[im 23/37  brain]
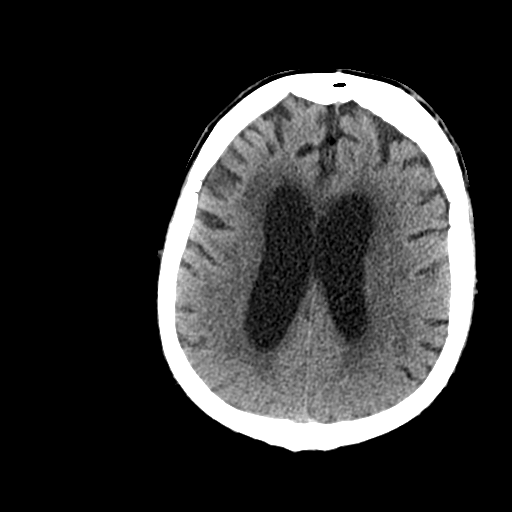
[im 27/37  brain]
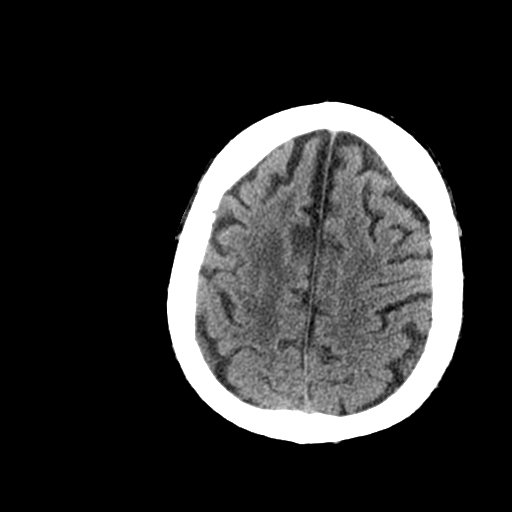
[im 30/37  brain]
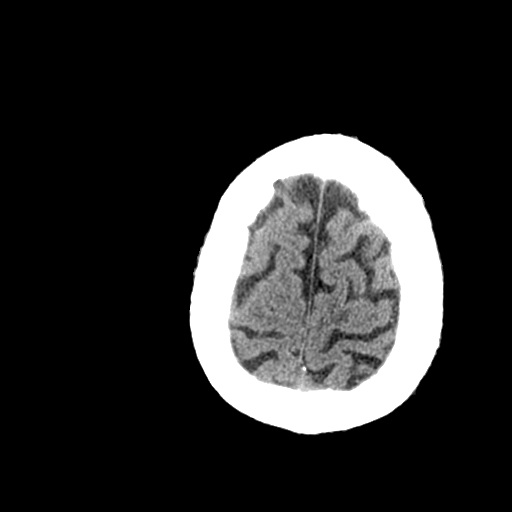
[im 34/37  brain]
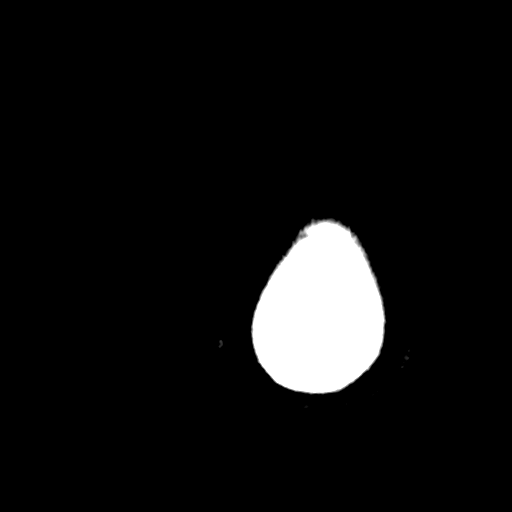
[im 34/37  bone]
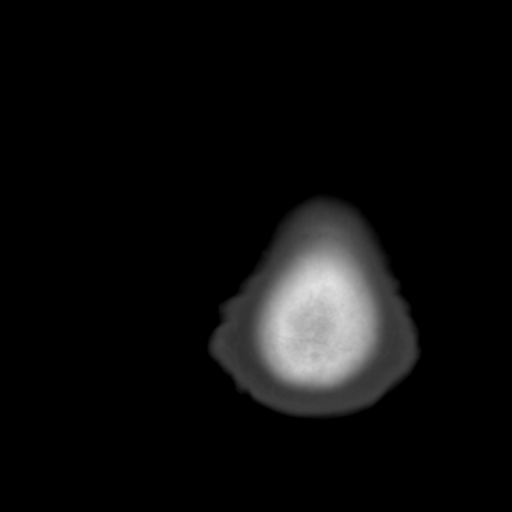

[Series 4: coronal soft tissue · coronal · 0.34mm/px · 3 of 71 slices shown]
[im 24/71  brain]
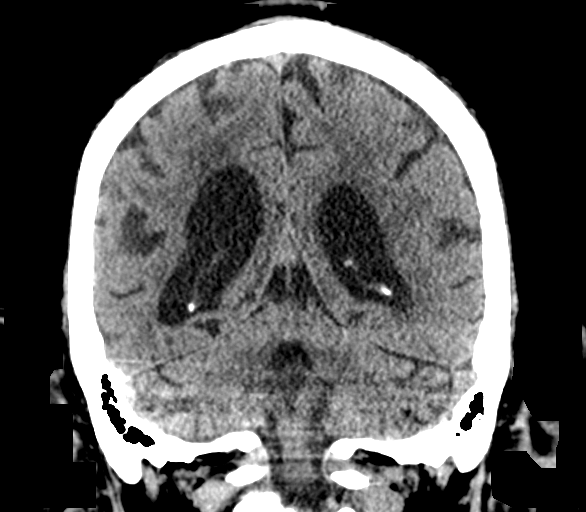
[im 32/71  brain]
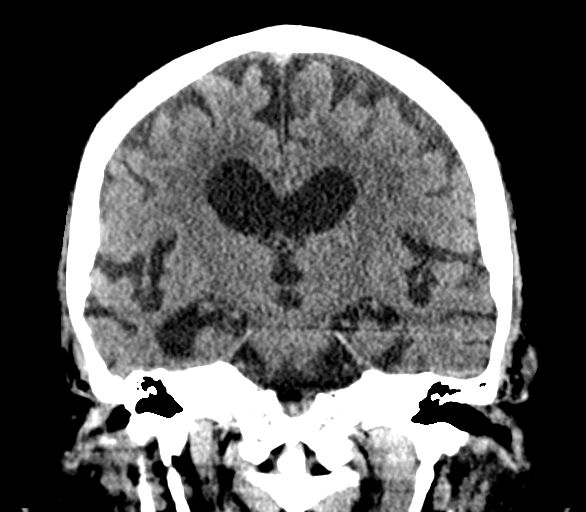
[im 39/71  brain]
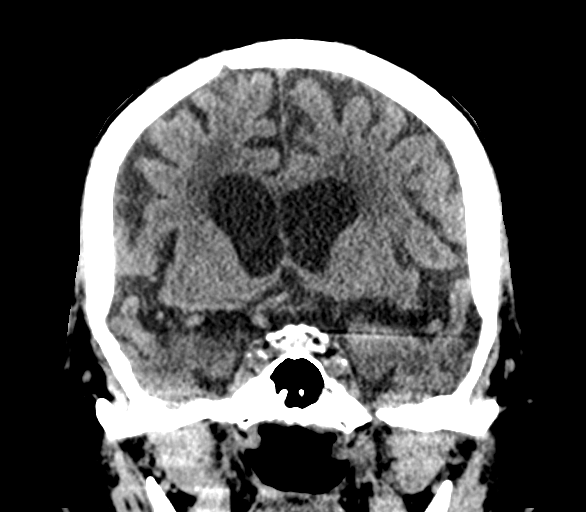

[Series 5: sagittal soft tissue · sagittal · 0.34mm/px · 3 of 66 slices shown]
[im 22/66  brain]
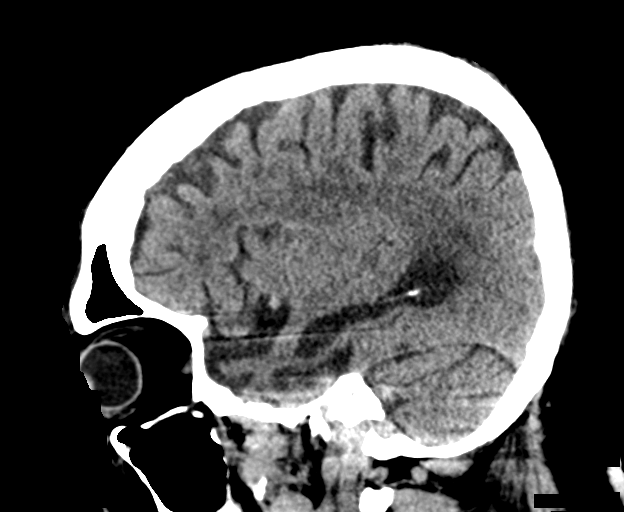
[im 33/66  brain]
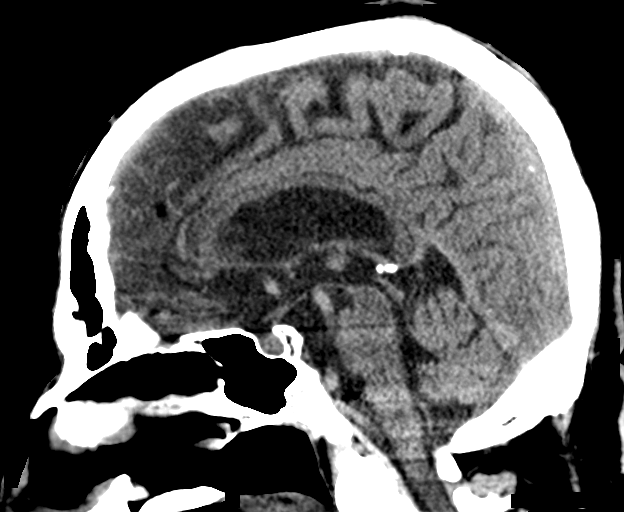
[im 44/66  brain]
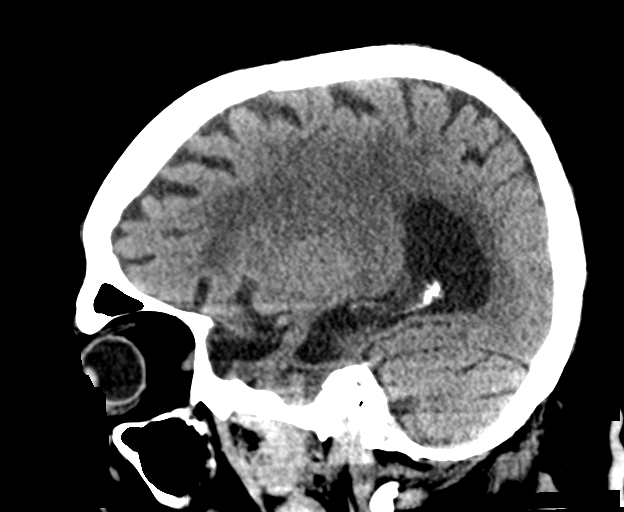

[15 of 47 positions shown; findings below may reference images not displayed]

FINDINGS: CT HEAD FINDINGS

Brain: No evidence of acute infarction, hemorrhage, hydrocephalus,
extra-axial collection or mass lesion/mass effect. Brain atrophy
especially notable in the temporal lobes, correlating with chart
history of dementia. Confluent chronic small vessel ischemia in the
deep white matter.

Vascular: No hyperdense vessel or unexpected calcification.

Skull: Normal. Negative for fracture or focal lesion.

Sinuses/Orbits: No acute finding.

CT CERVICAL SPINE FINDINGS

Alignment: Reversal of cervical lordosis with degenerative
anterolisthesis at C3-4 and C4-5.

Skull base and vertebrae: No acute fracture. No primary bone lesion
or focal pathologic process.

Soft tissues and spinal canal: No prevertebral fluid or swelling. No
visible canal hematoma. Goiter with chart history of hypothyroidism.

Disc levels: Disc and facet degeneration with facet ankylosis on the
left at C2-3.

Upper chest: Negative
IMPRESSION: No evidence of acute intracranial or cervical spine injury.
